# Patient Record
Sex: Female | Born: 1960 | Hispanic: No | Marital: Married | Smoking: Never smoker
Health system: Southern US, Community
[De-identification: ages and names within clinical notes are randomized; demographics above are authoritative.]

## PROBLEM LIST (undated history)

## (undated) DIAGNOSIS — I10 Essential (primary) hypertension: Secondary | ICD-10-CM

## (undated) DIAGNOSIS — E785 Hyperlipidemia, unspecified: Secondary | ICD-10-CM

## (undated) DIAGNOSIS — I2119 ST elevation (STEMI) myocardial infarction involving other coronary artery of inferior wall: Secondary | ICD-10-CM

## (undated) DIAGNOSIS — E119 Type 2 diabetes mellitus without complications: Secondary | ICD-10-CM

## (undated) HISTORY — DX: Type 2 diabetes mellitus without complications: E11.9

---

## 2017-07-20 ENCOUNTER — Ambulatory Visit: Payer: Self-pay | Admitting: Emergency Medicine

## 2018-01-04 ENCOUNTER — Inpatient Hospital Stay (HOSPITAL_COMMUNITY)
Admission: EM | Admit: 2018-01-04 | Discharge: 2018-01-07 | DRG: 854 | Disposition: A | Discharge status: Home / Self Care | Payer: Self-pay | Attending: Internal Medicine | Admitting: Internal Medicine

## 2018-01-04 ENCOUNTER — Encounter (HOSPITAL_COMMUNITY): Payer: Self-pay

## 2018-01-04 DIAGNOSIS — K8000 Calculus of gallbladder with acute cholecystitis without obstruction: Secondary | ICD-10-CM | POA: Diagnosis present

## 2018-01-04 DIAGNOSIS — R1013 Epigastric pain: Secondary | ICD-10-CM

## 2018-01-04 DIAGNOSIS — R739 Hyperglycemia, unspecified: Secondary | ICD-10-CM

## 2018-01-04 DIAGNOSIS — E119 Type 2 diabetes mellitus without complications: Secondary | ICD-10-CM

## 2018-01-04 DIAGNOSIS — E1165 Type 2 diabetes mellitus with hyperglycemia: Secondary | ICD-10-CM | POA: Diagnosis present

## 2018-01-04 DIAGNOSIS — K810 Acute cholecystitis: Secondary | ICD-10-CM

## 2018-01-04 DIAGNOSIS — E876 Hypokalemia: Secondary | ICD-10-CM | POA: Diagnosis present

## 2018-01-04 DIAGNOSIS — A419 Sepsis, unspecified organism: Principal | ICD-10-CM | POA: Diagnosis present

## 2018-01-04 HISTORY — DX: Type 2 diabetes mellitus without complications: E11.9

## 2018-01-04 LAB — COMPREHENSIVE METABOLIC PANEL
ALBUMIN: 4.4 g/dL (ref 3.5–5.0)
ALT: 24 U/L (ref 14–54)
AST: 37 U/L (ref 15–41)
Alkaline Phosphatase: 136 U/L — ABNORMAL HIGH (ref 38–126)
Anion gap: 14 (ref 5–15)
BILIRUBIN TOTAL: 0.8 mg/dL (ref 0.3–1.2)
BUN: 15 mg/dL (ref 6–20)
CHLORIDE: 98 mmol/L — AB (ref 101–111)
CO2: 22 mmol/L (ref 22–32)
Calcium: 10 mg/dL (ref 8.9–10.3)
Creatinine, Ser: 0.83 mg/dL (ref 0.44–1.00)
GFR calc Af Amer: >60 mL/min (ref >60)
GFR calc non Af Amer: >60 mL/min (ref >60)
GLUCOSE: 422 mg/dL — AB (ref 65–99)
Potassium: 3.2 mmol/L — ABNORMAL LOW (ref 3.5–5.1)
SODIUM: 134 mmol/L — AB (ref 135–145)
Total Protein: 8.2 g/dL — ABNORMAL HIGH (ref 6.5–8.1)

## 2018-01-04 LAB — I-STAT BETA HCG BLOOD, ED (MC, WL, AP ONLY): I-stat hCG, quantitative: 15.3 m[IU]/mL — ABNORMAL HIGH (ref <5)

## 2018-01-04 LAB — CBC
HEMATOCRIT: 47.4 % — AB (ref 36.0–46.0)
Hemoglobin: 16 g/dL — ABNORMAL HIGH (ref 12.0–15.0)
MCH: 28.4 pg (ref 26.0–34.0)
MCHC: 33.8 g/dL (ref 30.0–36.0)
MCV: 84 fL (ref 78.0–100.0)
Platelets: 304 10*3/uL (ref 150–400)
RBC: 5.64 MIL/uL — ABNORMAL HIGH (ref 3.87–5.11)
RDW: 12.8 % (ref 11.5–15.5)
WBC: 20.8 10*3/uL — ABNORMAL HIGH (ref 4.0–10.5)

## 2018-01-04 LAB — LIPASE, BLOOD: LIPASE: 30 U/L (ref 11–51)

## 2018-01-04 NOTE — ED Triage Notes (Signed)
Pt reports upper abd pain with n/v/d since 3pm

## 2018-01-05 ENCOUNTER — Inpatient Hospital Stay (HOSPITAL_COMMUNITY): Payer: Self-pay | Admitting: Anesthesiology

## 2018-01-05 ENCOUNTER — Encounter (HOSPITAL_COMMUNITY)
Admission: EM | Disposition: A | Discharge status: Home / Self Care | Payer: Self-pay | Source: Home / Self Care | Attending: Internal Medicine

## 2018-01-05 ENCOUNTER — Encounter (HOSPITAL_COMMUNITY): Payer: Self-pay | Admitting: Internal Medicine

## 2018-01-05 ENCOUNTER — Emergency Department (HOSPITAL_COMMUNITY): Payer: Self-pay

## 2018-01-05 DIAGNOSIS — E876 Hypokalemia: Secondary | ICD-10-CM | POA: Diagnosis present

## 2018-01-05 DIAGNOSIS — A419 Sepsis, unspecified organism: Principal | ICD-10-CM | POA: Diagnosis present

## 2018-01-05 DIAGNOSIS — E119 Type 2 diabetes mellitus without complications: Secondary | ICD-10-CM

## 2018-01-05 DIAGNOSIS — K810 Acute cholecystitis: Secondary | ICD-10-CM

## 2018-01-05 HISTORY — PX: CHOLECYSTECTOMY: SHX55

## 2018-01-05 LAB — CBG MONITORING, ED
GLUCOSE-CAPILLARY: 252 mg/dL — AB (ref 65–99)
Glucose-Capillary: 354 mg/dL — ABNORMAL HIGH (ref 65–99)

## 2018-01-05 LAB — URINALYSIS, ROUTINE W REFLEX MICROSCOPIC
BILIRUBIN URINE: NEGATIVE
KETONES UR: 20 mg/dL — AB
LEUKOCYTES UA: NEGATIVE
NITRITE: NEGATIVE
PH: 5 (ref 5.0–8.0)
Protein, ur: 30 mg/dL — AB
SPECIFIC GRAVITY, URINE: 1.035 — AB (ref 1.005–1.030)

## 2018-01-05 LAB — LACTIC ACID, PLASMA
Lactic Acid, Venous: 2.3 mmol/L (ref 0.5–1.9)
Lactic Acid, Venous: 3.4 mmol/L (ref 0.5–1.9)

## 2018-01-05 LAB — GLUCOSE, CAPILLARY
GLUCOSE-CAPILLARY: 230 mg/dL — AB (ref 65–99)
Glucose-Capillary: 148 mg/dL — ABNORMAL HIGH (ref 65–99)
Glucose-Capillary: 131 mg/dL — ABNORMAL HIGH (ref 65–99)
Glucose-Capillary: 315 mg/dL — ABNORMAL HIGH (ref 65–99)

## 2018-01-05 LAB — PROTIME-INR
INR: 1.09
PROTHROMBIN TIME: 14.1 s (ref 11.4–15.2)

## 2018-01-05 LAB — PROCALCITONIN: Procalcitonin: 1.48 ng/mL

## 2018-01-05 LAB — TYPE AND SCREEN
ABO/RH(D): O POS
ANTIBODY SCREEN: NEGATIVE

## 2018-01-05 LAB — HIV ANTIBODY (ROUTINE TESTING W REFLEX): HIV Screen 4th Generation wRfx: NONREACTIVE

## 2018-01-05 LAB — APTT: aPTT: 26 seconds (ref 24–36)

## 2018-01-05 LAB — HCG, QUANTITATIVE, PREGNANCY: hCG, Beta Chain, Quant, S: 14 m[IU]/mL — ABNORMAL HIGH (ref <5)

## 2018-01-05 LAB — ABO/RH: ABO/RH(D): O POS

## 2018-01-05 SURGERY — LAPAROSCOPIC CHOLECYSTECTOMY WITH INTRAOPERATIVE CHOLANGIOGRAM
Anesthesia: General | Site: Abdomen

## 2018-01-05 MED ORDER — PIPERACILLIN-TAZOBACTAM 3.375 G IVPB
3.3750 g | Freq: Three times a day (TID) | INTRAVENOUS | Status: DC
  Administered 2018-01-05 – 2018-01-06 (×4): 3.375 g via INTRAVENOUS
  Filled 2018-01-05 (×5): qty 50

## 2018-01-05 MED ORDER — METOCLOPRAMIDE HCL 5 MG/ML IJ SOLN
10.0000 mg | Freq: Once | INTRAMUSCULAR | Status: AC
  Administered 2018-01-05: 10 mg via INTRAVENOUS
  Filled 2018-01-05: qty 2

## 2018-01-05 MED ORDER — SODIUM CHLORIDE 0.9 % IV SOLN
INTRAVENOUS | Status: DC
  Administered 2018-01-05: 08:00:00 via INTRAVENOUS

## 2018-01-05 MED ORDER — DEXAMETHASONE SODIUM PHOSPHATE 10 MG/ML IJ SOLN
INTRAMUSCULAR | Status: AC
  Filled 2018-01-05: qty 3

## 2018-01-05 MED ORDER — KETOROLAC TROMETHAMINE 30 MG/ML IJ SOLN
INTRAMUSCULAR | Status: AC
  Filled 2018-01-05: qty 1

## 2018-01-05 MED ORDER — ETOMIDATE 2 MG/ML IV SOLN
INTRAVENOUS | Status: AC
  Filled 2018-01-05: qty 10

## 2018-01-05 MED ORDER — OXYCODONE-ACETAMINOPHEN 5-325 MG PO TABS
1.0000 | ORAL_TABLET | ORAL | Status: DC | PRN
  Administered 2018-01-05 – 2018-01-06 (×2): 1 via ORAL
  Filled 2018-01-05 (×2): qty 1

## 2018-01-05 MED ORDER — FENTANYL CITRATE (PF) 100 MCG/2ML IJ SOLN
INTRAMUSCULAR | Status: DC | PRN
  Administered 2018-01-05 (×3): 50 ug via INTRAVENOUS
  Administered 2018-01-05: 100 ug via INTRAVENOUS

## 2018-01-05 MED ORDER — ONDANSETRON HCL 4 MG/2ML IJ SOLN
INTRAMUSCULAR | Status: DC | PRN
  Administered 2018-01-05: 4 mg via INTRAVENOUS

## 2018-01-05 MED ORDER — METOPROLOL TARTRATE 5 MG/5ML IV SOLN
INTRAVENOUS | Status: AC
  Filled 2018-01-05: qty 5

## 2018-01-05 MED ORDER — INSULIN STARTER KIT- SYRINGES (ENGLISH)
1.0000 | Freq: Once | Status: AC
  Administered 2018-01-05: 1
  Filled 2018-01-05: qty 1

## 2018-01-05 MED ORDER — MORPHINE SULFATE (PF) 4 MG/ML IV SOLN: 2.0000 mg | INTRAVENOUS | Status: DC | PRN

## 2018-01-05 MED ORDER — LIVING WELL WITH DIABETES BOOK
Freq: Once | Status: AC
  Administered 2018-01-05: 18:00:00
  Filled 2018-01-05: qty 1

## 2018-01-05 MED ORDER — LACTATED RINGERS IV SOLN
INTRAVENOUS | Status: DC
  Administered 2018-01-05: 15:00:00 via INTRAVENOUS

## 2018-01-05 MED ORDER — ONDANSETRON HCL 4 MG/2ML IJ SOLN
4.0000 mg | Freq: Three times a day (TID) | INTRAMUSCULAR | Status: DC | PRN
  Administered 2018-01-05 – 2018-01-07 (×3): 4 mg via INTRAVENOUS
  Filled 2018-01-05 (×3): qty 2

## 2018-01-05 MED ORDER — BUPIVACAINE-EPINEPHRINE (PF) 0.25% -1:200000 IJ SOLN
INTRAMUSCULAR | Status: AC
  Filled 2018-01-05: qty 30

## 2018-01-05 MED ORDER — SUGAMMADEX SODIUM 200 MG/2ML IV SOLN
INTRAVENOUS | Status: DC | PRN
  Administered 2018-01-05: 200 mg via INTRAVENOUS

## 2018-01-05 MED ORDER — PHENYLEPHRINE 40 MCG/ML (10ML) SYRINGE FOR IV PUSH (FOR BLOOD PRESSURE SUPPORT)
PREFILLED_SYRINGE | INTRAVENOUS | Status: DC | PRN
  Administered 2018-01-05 (×2): 80 ug via INTRAVENOUS

## 2018-01-05 MED ORDER — INSULIN GLARGINE 100 UNIT/ML ~~LOC~~ SOLN
10.0000 [IU] | Freq: Every day | SUBCUTANEOUS | Status: DC
  Administered 2018-01-06: 10 [IU] via SUBCUTANEOUS
  Filled 2018-01-05: qty 0.1

## 2018-01-05 MED ORDER — ACETAMINOPHEN 325 MG PO TABS
650.0000 mg | ORAL_TABLET | Freq: Four times a day (QID) | ORAL | Status: DC | PRN
  Administered 2018-01-07: 650 mg via ORAL
  Filled 2018-01-05: qty 2

## 2018-01-05 MED ORDER — ZOLPIDEM TARTRATE 5 MG PO TABS: 5.0000 mg | ORAL_TABLET | Freq: Every evening | ORAL | Status: DC | PRN

## 2018-01-05 MED ORDER — SODIUM CHLORIDE 0.9 % IR SOLN
Status: DC | PRN
  Administered 2018-01-05: 1000 mL

## 2018-01-05 MED ORDER — ROCURONIUM BROMIDE 100 MG/10ML IV SOLN
INTRAVENOUS | Status: DC | PRN
  Administered 2018-01-05: 50 mg via INTRAVENOUS

## 2018-01-05 MED ORDER — INSULIN GLARGINE 100 UNIT/ML ~~LOC~~ SOLN
5.0000 [IU] | Freq: Every day | SUBCUTANEOUS | Status: DC
  Administered 2018-01-05: 5 [IU] via SUBCUTANEOUS
  Filled 2018-01-05: qty 0.05

## 2018-01-05 MED ORDER — MIDAZOLAM HCL 2 MG/2ML IJ SOLN
INTRAMUSCULAR | Status: AC
  Filled 2018-01-05: qty 2

## 2018-01-05 MED ORDER — HYDRALAZINE HCL 20 MG/ML IJ SOLN: 5.0000 mg | INTRAMUSCULAR | Status: DC | PRN

## 2018-01-05 MED ORDER — PROPOFOL 10 MG/ML IV BOLUS
INTRAVENOUS | Status: AC
  Filled 2018-01-05: qty 20

## 2018-01-05 MED ORDER — BUPIVACAINE-EPINEPHRINE 0.25% -1:200000 IJ SOLN
INTRAMUSCULAR | Status: DC | PRN
  Administered 2018-01-05: 14 mL

## 2018-01-05 MED ORDER — FENTANYL CITRATE (PF) 250 MCG/5ML IJ SOLN
INTRAMUSCULAR | Status: AC
  Filled 2018-01-05: qty 5

## 2018-01-05 MED ORDER — PROPOFOL 10 MG/ML IV BOLUS
INTRAVENOUS | Status: DC | PRN
  Administered 2018-01-05: 140 mg via INTRAVENOUS

## 2018-01-05 MED ORDER — POTASSIUM CHLORIDE 20 MEQ/15ML (10%) PO SOLN
40.0000 meq | Freq: Once | ORAL | Status: AC
  Administered 2018-01-05: 40 meq via ORAL
  Filled 2018-01-05: qty 30

## 2018-01-05 MED ORDER — LIDOCAINE 2% (20 MG/ML) 5 ML SYRINGE
INTRAMUSCULAR | Status: DC | PRN
  Administered 2018-01-05: 60 mg via INTRAVENOUS

## 2018-01-05 MED ORDER — 0.9 % SODIUM CHLORIDE (POUR BTL) OPTIME
TOPICAL | Status: DC | PRN
  Administered 2018-01-05: 1000 mL

## 2018-01-05 MED ORDER — MIDAZOLAM HCL 5 MG/5ML IJ SOLN
INTRAMUSCULAR | Status: DC | PRN
  Administered 2018-01-05: 2 mg via INTRAVENOUS

## 2018-01-05 MED ORDER — ONDANSETRON HCL 4 MG/2ML IJ SOLN
INTRAMUSCULAR | Status: AC
  Filled 2018-01-05: qty 4

## 2018-01-05 MED ORDER — METOPROLOL TARTRATE 5 MG/5ML IV SOLN
INTRAVENOUS | Status: DC | PRN
  Administered 2018-01-05: 1 mg via INTRAVENOUS

## 2018-01-05 MED ORDER — SODIUM CHLORIDE 0.9 % IV SOLN
INTRAVENOUS | Status: DC
  Administered 2018-01-05 – 2018-01-07 (×3): via INTRAVENOUS

## 2018-01-05 MED ORDER — DEXAMETHASONE SODIUM PHOSPHATE 10 MG/ML IJ SOLN
INTRAMUSCULAR | Status: DC | PRN
  Administered 2018-01-05: 10 mg via INTRAVENOUS

## 2018-01-05 MED ORDER — KETOROLAC TROMETHAMINE 30 MG/ML IJ SOLN
INTRAMUSCULAR | Status: DC | PRN
  Administered 2018-01-05: 30 mg via INTRAVENOUS

## 2018-01-05 MED ORDER — SODIUM CHLORIDE 0.9 % IV BOLUS
1000.0000 mL | Freq: Once | INTRAVENOUS | Status: AC
  Administered 2018-01-05: 1000 mL via INTRAVENOUS

## 2018-01-05 MED ORDER — INSULIN ASPART 100 UNIT/ML ~~LOC~~ SOLN
5.0000 [IU] | Freq: Once | SUBCUTANEOUS | Status: AC
  Administered 2018-01-05: 5 [IU] via SUBCUTANEOUS
  Filled 2018-01-05: qty 1

## 2018-01-05 MED ORDER — INSULIN ASPART 100 UNIT/ML ~~LOC~~ SOLN
0.0000 [IU] | Freq: Three times a day (TID) | SUBCUTANEOUS | Status: DC
  Administered 2018-01-05: 1 [IU] via SUBCUTANEOUS
  Administered 2018-01-05 – 2018-01-06 (×3): 5 [IU] via SUBCUTANEOUS
  Administered 2018-01-06: 2 [IU] via SUBCUTANEOUS
  Filled 2018-01-05: qty 1

## 2018-01-05 MED ORDER — MORPHINE SULFATE (PF) 4 MG/ML IV SOLN: 1.0000 mg | INTRAVENOUS | Status: DC | PRN

## 2018-01-05 MED ORDER — STERILE WATER FOR IRRIGATION IR SOLN
Status: DC | PRN
  Administered 2018-01-05: 1000 mL

## 2018-01-05 SURGICAL SUPPLY — 40 items
APPLIER CLIP 5 13 M/L LIGAMAX5 (MISCELLANEOUS) ×3
BLADE CLIPPER SURG (BLADE) IMPLANT
CANISTER SUCT 3000ML PPV (MISCELLANEOUS) ×3 IMPLANT
CHLORAPREP W/TINT 26ML (MISCELLANEOUS) ×3 IMPLANT
CLIP APPLIE 5 13 M/L LIGAMAX5 (MISCELLANEOUS) ×1 IMPLANT
CLOSURE WOUND 1/2 X4 (GAUZE/BANDAGES/DRESSINGS) ×1
COVER MAYO STAND STRL (DRAPES) ×3 IMPLANT
COVER SURGICAL LIGHT HANDLE (MISCELLANEOUS) ×3 IMPLANT
DERMABOND ADVANCED (GAUZE/BANDAGES/DRESSINGS) ×2
DERMABOND ADVANCED .7 DNX12 (GAUZE/BANDAGES/DRESSINGS) ×1 IMPLANT
DEVICE TROCAR PUNCTURE CLOSURE (ENDOMECHANICALS) ×3 IMPLANT
DRAPE C-ARM 42X72 X-RAY (DRAPES) ×3 IMPLANT
ELECT REM PT RETURN 9FT ADLT (ELECTROSURGICAL) ×3
ELECTRODE REM PT RTRN 9FT ADLT (ELECTROSURGICAL) ×1 IMPLANT
GLOVE BIO SURGEON STRL SZ7 (GLOVE) ×3 IMPLANT
GLOVE BIOGEL PI IND STRL 7.5 (GLOVE) ×1 IMPLANT
GLOVE BIOGEL PI INDICATOR 7.5 (GLOVE) ×2
GOWN STRL REUS W/ TWL LRG LVL3 (GOWN DISPOSABLE) ×3 IMPLANT
GOWN STRL REUS W/TWL LRG LVL3 (GOWN DISPOSABLE) ×6
KIT BASIN OR (CUSTOM PROCEDURE TRAY) ×3 IMPLANT
KIT TURNOVER KIT B (KITS) ×3 IMPLANT
NS IRRIG 1000ML POUR BTL (IV SOLUTION) ×3 IMPLANT
PAD ARMBOARD 7.5X6 YLW CONV (MISCELLANEOUS) ×3 IMPLANT
POUCH RETRIEVAL ECOSAC 10 (ENDOMECHANICALS) ×1 IMPLANT
POUCH RETRIEVAL ECOSAC 10MM (ENDOMECHANICALS) ×2
SCISSORS LAP 5X35 DISP (ENDOMECHANICALS) ×3 IMPLANT
SET CHOLANGIOGRAPH 5 50 .035 (SET/KITS/TRAYS/PACK) ×3 IMPLANT
SET IRRIG TUBING LAPAROSCOPIC (IRRIGATION / IRRIGATOR) ×3 IMPLANT
SLEEVE ENDOPATH XCEL 5M (ENDOMECHANICALS) ×6 IMPLANT
SPECIMEN JAR SMALL (MISCELLANEOUS) ×3 IMPLANT
STRIP CLOSURE SKIN 1/2X4 (GAUZE/BANDAGES/DRESSINGS) ×2 IMPLANT
SUT MNCRL AB 4-0 PS2 18 (SUTURE) ×3 IMPLANT
SUT VICRYL 0 UR6 27IN ABS (SUTURE) ×6 IMPLANT
TOWEL OR 17X24 6PK STRL BLUE (TOWEL DISPOSABLE) ×3 IMPLANT
TOWEL OR 17X26 10 PK STRL BLUE (TOWEL DISPOSABLE) ×3 IMPLANT
TRAY LAPAROSCOPIC MC (CUSTOM PROCEDURE TRAY) ×3 IMPLANT
TROCAR XCEL BLUNT TIP 100MML (ENDOMECHANICALS) ×3 IMPLANT
TROCAR XCEL NON-BLD 5MMX100MML (ENDOMECHANICALS) ×3 IMPLANT
TUBING INSUFFLATION (TUBING) ×3 IMPLANT
WATER STERILE IRR 1000ML POUR (IV SOLUTION) ×3 IMPLANT

## 2018-01-05 NOTE — Progress Notes (Addendum)
Patient arrived to 6n22 A&Ox4, VSS, IV intact.  Noted to have 4 port sites present on abdomen with steri strip closure.  Denies pain at this time.  Son at bedside as Nurse, learning disabilitytranslator.  Will continue to monitor.

## 2018-01-05 NOTE — Progress Notes (Signed)
Patient arrived from ED to 6n22 A&Ox4, VSS, IV intact and infusing.  Son present at bedside.  Skin intact with no issues.  Patient and family oriented to room and equipment.  Will continue to monitor.

## 2018-01-05 NOTE — H&P (Signed)
History and Physical    Renn Stille ZOX:[id number] DOB: 21-Oct-1960 DOA: 01/04/2018  Referring MD/NP/PA:   PCP: Patient, No Pcp Per   Patient coming from:  The patient is coming from home.  At baseline, pt is independent for most of ADL.  Chief Complaint: Nausea, vomiting, diarrhea, abdominal pain  HPI: Stephanie Cooke is a 57 y.o. female without significant medical history, who presents with nausea, vomiting, diarrhea and abdominal pain.  Pt speaks arabic language, does not understand [place]. History is obtained through translator and partially with help of her son. Patient states that her symptoms started yesterday. She has nausea, vomiting, diarrhea, abdominal pain. She vomited 4 times and had for loose stool bowel movement today. Her abdominal pain is located in the right upper quadrant, intermittent, sharp, nonradiating. Patient does not have fever or chills. She denies chest pain, shortness of breath, cough symptoms of UTI. No unilateral weakness. She states that she occasionally takes ibuprofen or Tylenol for mild headache. No history of DM.   ED Course: pt was found to have WBC 20.8, POC bHCG 15.3, potassium 3.2, normal LFT, lipase normal, creatinine normal, tachycardia, tachypnea, oxygen 97% on room air. Abdominal ultrasound showed cholecystitis and common bile duct dilation 8.2 mm. Pt is admitted to MedSurg bed as inpatient. Gen. Surgeon, Dr. Cliffton Asters was consulted.  Review of Systems:   General: no fevers, chills, no body weight gain, has poor appetite, has fatigue HEENT: no blurry vision, hearing changes or sore throat Respiratory: no dyspnea, coughing, wheezing CV: no chest pain, no palpitations GI: has nausea, vomiting, abdominal pain, diarrhea, no constipation GU: no dysuria, burning on urination, increased urinary frequency, hematuria  Ext: no leg edema Neuro: no unilateral weakness, numbness, or tingling, no vision change or hearing loss Skin: no rash, no skin tear. MSK: No  muscle spasm, no deformity, no limitation of range of movement in spin Heme: No easy bruising.  Travel history: No recent long distant travel.  Allergy: No Known Allergies  History reviewed. No pertinent past medical history.  History reviewed. No pertinent surgical history.  Social History:  reports that she has never smoked. She has never used smokeless tobacco. She reports that she drank alcohol. She reports that she has current or past drug history.  Family History:  Family History  Problem Relation Age of Onset  . Hypertension Maternal Aunt      Prior to Admission medications   Not on File    Physical Exam: Vitals:   01/05/18 0415 01/05/18 0430 01/05/18 0445 01/05/18 0500  BP:  127/77 137/86 139/84  Pulse: 94 (!) 101 (!) 107 (!) 104  Resp: 20 20 15 16   Temp:      TempSrc:      SpO2: 99% 98% 99% 100%   General: Not in acute distress. dry mucus and membrane HEENT:       Eyes: PERRL, EOMI, no scleral icterus.       ENT: No discharge from the ears and nose, no pharynx injection, no tonsillar enlargement.        Neck: No JVD, no bruit, no mass felt. Heme: No neck lymph node enlargement. Cardiac: S1/S2, RRR, No murmurs, No gallops or rubs. Respiratory: No rales, wheezing, rhonchi or rubs. GI: Soft, nondistended, has tenderness in RUQ, no rebound pain, no organomegaly, BS present. GU: No hematuria Ext: No pitting leg edema bilaterally. 2+DP/PT pulse bilaterally. Musculoskeletal: No joint deformities, No joint redness or warmth, no limitation of ROM in spin. Skin: No rashes.  Neuro:  Alert, oriented X3, cranial nerves II-XII grossly intact, moves all extremities normally.  Psych: Patient is not psychotic, no suicidal or hemocidal ideation.  Labs on Admission: I have personally reviewed following labs and imaging studies  CBC: Recent Labs  Lab 01/04/18 2221  WBC 20.8*  HGB 16.0*  HCT 47.4*  MCV 84.0  PLT 304   Basic Metabolic Panel: Recent Labs  Lab  01/04/18 2221  NA 134*  K 3.2*  CL 98*  CO2 22  GLUCOSE 422*  BUN 15  CREATININE 0.83  CALCIUM 10.0   GFR: CrCl cannot be calculated (Unknown ideal weight.). Liver Function Tests: Recent Labs  Lab 01/04/18 2221  AST 37  ALT 24  ALKPHOS 136*  BILITOT 0.8  PROT 8.2*  ALBUMIN 4.4   Recent Labs  Lab 01/04/18 2221  LIPASE 30   No results for input(s): AMMONIA in the last 168 hours. Coagulation Profile: No results for input(s): INR, PROTIME in the last 168 hours. Cardiac Enzymes: No results for input(s): CKTOTAL, CKMB, CKMBINDEX, TROPONINI in the last 168 hours. BNP (last 3 results) No results for input(s): PROBNP in the last 8760 hours. HbA1C: No results for input(s): HGBA1C in the last 72 hours. CBG: No results for input(s): GLUCAP in the last 168 hours. Lipid Profile: No results for input(s): CHOL, HDL, LDLCALC, TRIG, CHOLHDL, LDLDIRECT in the last 72 hours. Thyroid Function Tests: No results for input(s): TSH, T4TOTAL, FREET4, T3FREE, THYROIDAB in the last 72 hours. Anemia Panel: No results for input(s): VITAMINB12, FOLATE, FERRITIN, TIBC, IRON, RETICCTPCT in the last 72 hours. Urine analysis: No results found for: COLORURINE, APPEARANCEUR, LABSPEC, PHURINE, GLUCOSEU, HGBUR, BILIRUBINUR, KETONESUR, PROTEINUR, UROBILINOGEN, NITRITE, LEUKOCYTESUR Sepsis Labs: @LABRCNTIP (procalcitonin:4,lacticidven:4) )No results found for this or any previous visit (from the past 240 hour(s)).   Radiological Exams on Admission: [place] Abdomen Limited Ruq  Result Date: 01/05/2018 CLINICAL DATA:  Epigastric pain. EXAM: ULTRASOUND ABDOMEN LIMITED RIGHT UPPER QUADRANT COMPARISON:  None. FINDINGS: Gallbladder: There is a large mobile stone in the gallbladder measuring about 2.8 cm diameter. Mild pericholecystic edema with mild wall thickening at 4.2 mm. Murphy's sign is negative. Common bile duct: Diameter: 8.2 mm, dilated. No intraluminal stones are demonstrated but the distal duct is  obscured by bowel gas. Liver: Diffusely increased hepatic parenchymal echotexture likely indicating fatty infiltration. No focal lesions identified. Portal vein is patent on color Doppler imaging with normal direction of blood flow towards the liver. IMPRESSION: Cholelithiasis with gallbladder wall thickening and edema suggestive of cholecystitis. Murphy's sign is negative. Mild bile duct dilatation. Fatty infiltration of the liver. Electronically Signed   By: Burman NievesWilliam  Stevens M.D.   On: 01/05/2018 04:18     EKG:  Not done in ED, will get one.   Assessment/Plan Principal Problem:   Acute cholecystitis Active Problems:   Sepsis (HCC)   New onset type 2 diabetes mellitus (HCC)   Hypokalemia   Sepsis due to acute cholecystitis: Patient's nausea, vomiting, abdominal pain most likely due to cholecystitis as evidenced by abdominal ultrasound. Patient also has diarrhea, which may be due to cholecystitis. Will observe closely, if getting worse, may need to check C. diff PCR. Patient meets criteria for sepsis with leukocytosis, tachycardia and tachypnea. Currently hemodynamically stable.Gen. Surgeon, Dr. Cliffton AstersWhite was consulted-->likely cholecystectomy when blood sugar is controled.  -will admit to med-surg bed as inpt -start zosyn IV -prn Zofran for nausea, morphine for pain -will get Procalcitonin and trend lactic acid levels per sepsis protocol. -IVF: 2L of NS bolus in ED,  followed by 125 cc/h  -f/u Bx -F/u quantitative beta-hCG  Hypokalemia: K=3.2 on admission. - Repleted - Check Mg level  New onset type 2 diabetes mellitus (HCC):  Pt's blood sugar is 482, likely has new onset DM. Renal function normal. -will start lantus 5 U daily -SSI -check A1c and FLP -consult to diabetic educator.   DVT ppx: SCD Code Status: Full code Family Communication:   Yes, patient's son   at bed side Disposition Plan:  Anticipate discharge back to previous home environment Consults called:  Gen. Surgeon,  Dr. Cliffton Asters Admission status: medical floor/inpt   Date of Service 01/05/2018    Lorretta Harp [facility] Pager [phone]  If 7PM-7AM, please contact night-coverage [email or web address] Password [facility] 01/05/2018, 5:48 AM

## 2018-01-05 NOTE — Interval H&P Note (Signed)
History and Physical Interval Note:  01/05/2018 3:19 PM I have seen patient.  Discussed lap chole with her via the son.  Translator service does not work due to her dialect.   Stephanie Cooke  has presented today for surgery, with the diagnosis of cholecysitis  The various methods of treatment have been discussed with the patient and family. After consideration of risks, benefits and other options for treatment, the patient has consented to  Procedure(s): LAPAROSCOPIC CHOLECYSTECTOMY WITH INTRAOPERATIVE CHOLANGIOGRAM (N/A) as a surgical intervention .  The patient's history has been reviewed, patient examined, no change in status, stable for surgery.  I have reviewed the patient's chart and labs.  Questions were answered to the patient's satisfaction.     Stephanie Cooke

## 2018-01-05 NOTE — ED Notes (Signed)
Pt CBG was 252, notified Holley(RN)

## 2018-01-05 NOTE — Progress Notes (Signed)
CRITICAL VALUE ALERT  Critical Value: Lactic Acid 3.4  Date & Time Notied:  11:17am 01/05/2018  Provider Notified: Mikeal HawthorneGarba  Orders Received/Actions taken: MD notified

## 2018-01-05 NOTE — Care Management Note (Addendum)
Case Management Note  Patient Details  Name: Stephanie Cooke MRN: [id number] Date of Birth: 03/26/1961  Subjective/Objective:                    Action/Plan: Follow up hospital appointment at [facility] January 31, 2018 at 3 pm  [facility] , medication assistance once discharge medications determined . Will continue to follow .  Expected Discharge Date:                  Expected Discharge Plan:  Home/Self Care  In-House Referral:  Financial Counselor  Discharge planning Services  CM Consult, [facility], [facility], Medication Assistance  Post Acute Care Choice:  NA Choice offered to:     DME Arranged:  N/A DME Agency:     HH Arranged:  NA HH Agency:  NA  Status of Service:  In process, will continue to follow  If discussed at Long Length of Stay Meetings, dates discussed:    Additional Comments:  Stephanie Cooke, Stephanie Primm Marie, RN 01/05/2018, 11:31 AM

## 2018-01-05 NOTE — H&P (Signed)
CC: RUQ pain, consult by Amie Portland PA-C. Interpreter service used. Family had left the room.  HPI: Stephanie Cooke is an 57 y.o. female who is here for 1d hx of RUQ pain, sharp, intermittent associated with n/v/d. 4 episodes of emesis. 4 episodes of loose stool. Pain does not radiate. Nothing makes pain better or worse.  Denies fever/chills. Over last day, pain comes and goes but does not seem to be related to anything. Nothing to eat/drink since 5pm 4/3.  PSH: Denies prior surgeries  PMH: Denies known health history including being told she did not have DM 19yrago.  History reviewed. No pertinent past medical history.  History reviewed. No pertinent surgical history.  No family history on file.  Social: Denies tobacco/EtOH/drugs  Allergies: No Known Allergies  Medications: I have reviewed the patient's current medications.  Results for orders placed or performed during the hospital encounter of 01/04/18 (from the past 48 hour(s))  Lipase, blood     Status: None   Collection Time: 01/04/18 10:21 PM  Result Value Ref Range   Lipase 30 11 - 51 U/L    Comment: Performed at [facility] [id number] [place] [address] Comprehensive metabolic panel     Status: Abnormal   Collection Time: 01/04/18 10:21 PM  Result Value Ref Range   Sodium 134 (L) 135 - 145 mmol/L   Potassium 3.2 (L) 3.5 - 5.1 mmol/L   Chloride 98 (L) 101 - 111 mmol/L   CO2 22 22 - 32 mmol/L   Glucose, Bld 422 (H) 65 - 99 mg/dL   BUN 15 6 - 20 mg/dL   Creatinine, Ser 0.83 0.44 - 1.00 mg/dL   Calcium 10.0 8.9 - 10.3 mg/dL   Total Protein 8.2 (H) 6.5 - 8.1 g/dL   Albumin 4.4 3.5 - 5.0 g/dL   AST 37 15 - 41 U/L   ALT 24 14 - 54 U/L   Alkaline Phosphatase 136 (H) 38 - 126 U/L   Total Bilirubin 0.8 0.3 - 1.2 mg/dL   GFR calc non Af Amer >60 >60 mL/min   GFR calc Af Amer >60 >60 mL/min    Comment: (NOTE) The eGFR has been calculated using the CKD EPI equation. This calculation has not been  validated in all clinical situations. eGFR's persistently <60 mL/min signify possible Chronic Kidney Disease.    Anion gap 14 5 - 15    Comment: Performed at [address] [id number] CBC     Status: Abnormal   Collection Time: 01/04/18 10:21 PM  Result Value Ref Range   WBC 20.8 (H) 4.0 - 10.5 K/uL   RBC 5.64 (H) 3.87 - 5.11 MIL/uL   Hemoglobin 16.0 (H) 12.0 - 15.0 g/dL   HCT 47.4 (H) 36.0 - 46.0 %   MCV 84.0 78.0 - 100.0 fL   MCH 28.4 26.0 - 34.0 pg   MCHC 33.8 30.0 - 36.0 g/dL   RDW 12.8 11.5 - 15.5 %   Platelets 304 150 - 400 K/uL    Comment: Performed at [facility], [place] [address] I-Stat beta hCG blood, ED     Status: Abnormal   Collection Time: 01/04/18 10:38 PM  Result Value Ref Range   I-stat hCG, quantitative 15.3 (H) <5 mIU/mL   Comment 3            Comment:   GEST. AGE  CONC.  (mIU/mL)   <=1 WEEK        5 - 50     2 WEEKS       50 - 500     3 WEEKS       100 - 10,000     4 WEEKS     1,000 - 30,000        FEMALE AND NON-PREGNANT FEMALE:     LESS THAN 5 mIU/mL     US Abdomen Limited Ruq  Result Date: 01/05/2018 CLINICAL DATA:  Epigastric pain. EXAM: ULTRASOUND ABDOMEN LIMITED RIGHT UPPER QUADRANT COMPARISON:  None. FINDINGS: Gallbladder: There is a large mobile stone in the gallbladder measuring about 2.8 cm diameter. Mild pericholecystic edema with mild wall thickening at 4.2 mm. Murphy's sign is negative. Common bile duct: Diameter: 8.2 mm, dilated. No intraluminal stones are demonstrated but the distal duct is obscured by bowel gas. Liver: Diffusely increased hepatic parenchymal echotexture likely indicating fatty infiltration. No focal lesions identified. Portal vein is patent on color Doppler imaging with normal direction of blood flow towards the liver. IMPRESSION: Cholelithiasis with gallbladder wall thickening and edema suggestive of cholecystitis. Murphy's sign is negative. Mild bile  duct dilatation. Fatty infiltration of the liver. Electronically Signed   By: Lucienne Capers M.D.   On: 01/05/2018 04:18    ROS - all of the below systems have been reviewed with the patient and positives are indicated with bold text General: chills, fever or night sweats Eyes: blurry vision or double vision ENT: epistaxis or sore throat Allergy/Immunology: itchy/watery eyes or nasal congestion Hematologic/Lymphatic: bleeding problems, blood clots or swollen lymph nodes Endocrine: temperature intolerance or unexpected weight changes Breast: new or changing breast lumps or nipple discharge Resp: cough, shortness of breath, or wheezing CV: chest pain or dyspnea on exertion GI: as per HPI GU: dysuria, trouble voiding, or hematuria MSK: joint pain or joint stiffness Neuro: TIA or stroke symptoms Derm: pruritus and skin lesion changes Psych: anxiety and depression  PE Blood pressure (!) 112/95, pulse 94, temperature 99.2 F (37.3 C), temperature source Oral, resp. rate 20, SpO2 99 %. Constitutional: NAD; conversant; no deformities Eyes: Moist conjunctiva; no lid lag; anicteric; PERRL Neck: Trachea midline; no thyromegaly Lungs: Normal respiratory effort; no tactile fremitus CV: RRR; no palpable thrills; no pitting edema GI: Abd soft, mildly ttp in RUQ; nondistended; no palpable hepatosplenomegaly. Negative Murphy's sign MSK: Normal gait; no clubbing/cyanosis Psychiatric: Appropriate affect; alert and oriented x3 Lymphatic: No palpable cervical or axillary lymphadenopathy  Results for orders placed or performed during the hospital encounter of 01/04/18 (from the past 48 hour(s))  Lipase, blood     Status: None   Collection Time: 01/04/18 10:21 PM  Result Value Ref Range   Lipase 30 11 - 51 U/L    Comment: Performed at [facility], [place] [address], [place], [place] [address]  Comprehensive metabolic panel     Status: Abnormal   Collection Time: 01/04/18 10:21 PM  Result  Value Ref Range   Sodium 134 (L) 135 - 145 mmol/L   Potassium 3.2 (L) 3.5 - 5.1 mmol/L   Chloride 98 (L) 101 - 111 mmol/L   CO2 22 22 - 32 mmol/L   Glucose, Bld 422 (H) 65 - 99 mg/dL   BUN 15 6 - 20 mg/dL   Creatinine, Ser 0.83 0.44 - 1.00 mg/dL   Calcium 10.0 8.9 - 10.3 mg/dL   Total Protein 8.2 (H) 6.5 - 8.1 g/dL  Albumin 4.4 3.5 - 5.0 g/dL   AST 37 15 - 41 U/L   ALT 24 14 - 54 U/L   Alkaline Phosphatase 136 (H) 38 - 126 U/L   Total Bilirubin 0.8 0.3 - 1.2 mg/dL   GFR calc non Af Amer >60 >60 mL/min   GFR calc Af Amer >60 >60 mL/min    Comment: (NOTE) The eGFR has been calculated using the CKD EPI equation. This calculation has not been validated in all clinical situations. eGFR's persistently <60 mL/min signify possible Chronic Kidney Disease.    Anion gap 14 5 - 15    Comment: Performed at [facility] [address] [place], [place] [address]  CBC     Status: Abnormal   Collection Time: 01/04/18 10:21 PM  Result Value Ref Range   WBC 20.8 (H) 4.0 - 10.5 K/uL   RBC 5.64 (H) 3.87 - 5.11 MIL/uL   Hemoglobin 16.0 (H) 12.0 - 15.0 g/dL   HCT 47.4 (H) 36.0 - 46.0 %   MCV 84.0 78.0 - 100.0 fL   MCH 28.4 26.0 - 34.0 pg   MCHC 33.8 30.0 - 36.0 g/dL   RDW 12.8 11.5 - 15.5 %   Platelets 304 150 - 400 K/uL    Comment: Performed at [facility] [address] [place], [place] [address]  I-Stat beta hCG blood, ED     Status: Abnormal   Collection Time: 01/04/18 10:38 PM  Result Value Ref Range   I-stat hCG, quantitative 15.3 (H) <5 mIU/mL   Comment 3            Comment:   GEST. AGE      CONC.  (mIU/mL)   <=1 WEEK        5 - 50     2 WEEKS       50 - 500     3 WEEKS       100 - 10,000     4 WEEKS     1,000 - 30,000        FEMALE AND NON-PREGNANT FEMALE:     LESS THAN 5 mIU/mL     US Abdomen Limited Ruq  Result Date: 01/05/2018 CLINICAL DATA:  Epigastric pain. EXAM: ULTRASOUND ABDOMEN LIMITED RIGHT UPPER QUADRANT COMPARISON:  None. FINDINGS: Gallbladder:  There is a large mobile stone in the gallbladder measuring about 2.8 cm diameter. Mild pericholecystic edema with mild wall thickening at 4.2 mm. Murphy's sign is negative. Common bile duct: Diameter: 8.2 mm, dilated. No intraluminal stones are demonstrated but the distal duct is obscured by bowel gas. Liver: Diffusely increased hepatic parenchymal echotexture likely indicating fatty infiltration. No focal lesions identified. Portal vein is patent on color Doppler imaging with normal direction of blood flow towards the liver. IMPRESSION: Cholelithiasis with gallbladder wall thickening and edema suggestive of cholecystitis. Murphy's sign is negative. Mild bile duct dilatation. Fatty infiltration of the liver. Electronically Signed   By: Lucienne Capers M.D.   On: 01/05/2018 04:18    A/P: Joline Encalada is an 57 y.o. female with likely new onset DM here with acute cholecystitis  -Medicine admission given newly diagnosed diabetes and CBG 422 here - appreciate the assistance in her care -NPO, IVF, IV Zosyn -Quant hCG -The anatomy & physiology of hepatobiliary & pancreatic function was discussed.  The pathophysiology of gallbladder dysfunction was discussed.  Natural history risks without surgery was discussed.   I feel the risks of no intervention will lead  to serious problems that outweigh the operative risks; therefore, I recommended cholecystectomy to remove the pathology.  I explained laparoscopic techniques with possible need for an open approach.  Possible cholangiogram to evaluate the bilary tract was explained as well.   -The planned procedure, material risks (including but not limited to pain, bleeding, infection, abscess, bile leak, injury to surrounding structures/bowel/vessels/nerves/common bile duct, need for additional procedures, hernia, heart attack, stroke, death.  I noted a good likelihood this will help address the problem.  Possibility that this will not correct all abdominal symptoms was  explained.  Goals of post-operative recovery were discussed as well.  She and her son's questions were answered to their satisfaction, she voiced understanding and wishes to proceed with surgery. -I discussed that Dr. Donne Hazel would be by to see her prior to surgery and would likely be the physician performing her surgery once her blood sugar is more normal  Sharon Mt. Dema Severin, M.D. [facility]

## 2018-01-05 NOTE — ED Provider Notes (Signed)
[facility] EMERGENCY DEPARTMENT Provider Note   CSN: [id number] Arrival date & time: 01/04/18  2151     History   Chief Complaint Chief Complaint  Patient presents with  . Abdominal Pain    HPI Ethleen Lormand is a 57 y.o. female.  The history is provided by the patient and medical records. Language interpreter used: Family at bedside aiding in translation.     Breland Elders is a 57 y.o. female  with no known PMH who presents to the Emergency Department complaining of acute onset of upper abdominal pain which began about 3PM this afternoon. Pain and nausea have increased throughout the night. Associated with nausea, 5-6 episodes of emesis and multiple non-bloody loose stools. Denies hx of similar. No medications taken prior to arrival. No fever, chills, chest pain or shortness of breath.    History reviewed. No pertinent past medical history.  Patient Active Problem List   Diagnosis Date Noted  . Acute cholecystitis 01/05/2018  . Diabetes mellitus without complication ([place]) 52/77/8242  . Sepsis ([place]) 01/05/2018    History reviewed. No pertinent surgical history.   OB History   None      Home Medications    Prior to Admission medications   Not on File    Family History No family history on file.  Social History Social History   Tobacco Use  . Smoking status: Not on file  Substance Use Topics  . Alcohol use: Not on file  . Drug use: Not on file     Allergies   Patient has no known allergies.   Review of Systems Review of Systems  Gastrointestinal: Positive for abdominal pain, diarrhea, nausea and vomiting.  All other systems reviewed and are negative.    Physical Exam Updated Vital Signs BP (!) 112/95   Pulse 94   Temp 99.2 F (37.3 C) (Oral)   Resp 20   SpO2 99%   Physical Exam  Constitutional: She is oriented to person, place, and time. She appears well-developed and well-nourished. No distress.  HENT:  Head:  Normocephalic and atraumatic.  Cardiovascular: Normal rate, regular rhythm and normal heart sounds.  No murmur heard. Pulmonary/Chest: Effort normal and breath sounds normal. No respiratory distress.  Abdominal: Soft. Bowel sounds are normal. She exhibits no distension.  Tenderness to palpation of epigastrium and RUQ.   Musculoskeletal: She exhibits no edema.  Neurological: She is alert and oriented to person, place, and time.  Skin: Skin is warm and dry.  Nursing note and vitals reviewed.    ED Treatments / Results  Labs (all labs ordered are listed, but only abnormal results are displayed) Labs Reviewed  COMPREHENSIVE METABOLIC PANEL - Abnormal; Notable for the following components:      Result Value   Sodium 134 (*)    Potassium 3.2 (*)    Chloride 98 (*)    Glucose, Bld 422 (*)    Total Protein 8.2 (*)    Alkaline Phosphatase 136 (*)    All other components within normal limits  CBC - Abnormal; Notable for the following components:   WBC 20.8 (*)    RBC 5.64 (*)    Hemoglobin 16.0 (*)    HCT 47.4 (*)    All other components within normal limits  I-STAT BETA HCG BLOOD, ED ([facility], [facility], [facility] ONLY) - Abnormal; Notable for the following components:   I-stat hCG, quantitative 15.3 (*)    All other components within normal limits  LIPASE, BLOOD  URINALYSIS,  ROUTINE W REFLEX MICROSCOPIC    EKG None  Radiology US Abdomen Limited Ruq  Result Date: 01/05/2018 CLINICAL DATA:  Epigastric pain. EXAM: ULTRASOUND ABDOMEN LIMITED RIGHT UPPER QUADRANT COMPARISON:  None. FINDINGS: Gallbladder: There is a large mobile stone in the gallbladder measuring about 2.8 cm diameter. Mild pericholecystic edema with mild wall thickening at 4.2 mm. Murphy's sign is negative. Common bile duct: Diameter: 8.2 mm, dilated. No intraluminal stones are demonstrated but the distal duct is obscured by bowel gas. Liver: Diffusely increased hepatic parenchymal echotexture likely indicating fatty infiltration. No  focal lesions identified. Portal vein is patent on color Doppler imaging with normal direction of blood flow towards the liver. IMPRESSION: Cholelithiasis with gallbladder wall thickening and edema suggestive of cholecystitis. Murphy's sign is negative. Mild bile duct dilatation. Fatty infiltration of the liver. Electronically Signed   By: Lucienne Capers M.D.   On: 01/05/2018 04:18    Procedures Procedures (including critical care time)  Medications Ordered in ED Medications  sodium chloride 0.9 % bolus 1,000 mL (1,000 mLs Intravenous New Bag/Given 01/05/18 0353)  metoCLOPramide (REGLAN) injection 10 mg (10 mg Intravenous Given 01/05/18 0353)     Initial Impression / Assessment and Plan / ED Course  I have reviewed the triage vital signs and the nursing notes.  Pertinent labs & imaging results that were available during my care of the patient were reviewed by me and considered in my medical decision making (see chart for details).    Alexsia Klindt is a 57 y.o. female who presents to ED for acute onset of upper abdominal pain associated with n/v/d. On exam, patient is afebrile, hemodynamically stable with tenderness to epigastrium and RUQ. Labs reviewed: leukocytosis of 20.8. Alk phos of 136. Lipase wdl. Glucose elevated at 422 (normal AG and Co2). Patient denies history of DM. Family report that her last doctor's appointment was a little over a year ago and did not mention anything about high sugar levels. Likely new diagnosis of DM. RUQ ultrasound obtained showing findings concerning for acute cholecystitis. General surgery, Dr. Dema Severin, consulted who will evaluate patient. Given likely new onset DM, requesting medical admission. Hospitalist consulted who will admit.   Patient discussed with Dr. Leonides Schanz who agrees with treatment plan.   Final Clinical Impressions(s) / ED Diagnoses   Final diagnoses:  Epigastric pain  Acute cholecystitis  Hyperglycemia    ED Discharge Orders    None         Siham Bucaro, Ozella Almond, PA-C 01/05/18 0516    Vonne Mcdanel, Delice Bison, DO 01/05/18 [id number]

## 2018-01-05 NOTE — Anesthesia Preprocedure Evaluation (Addendum)
Anesthesia Evaluation  Patient identified by MRN, date of birth, ID band Patient awake    Reviewed: Allergy & Precautions, NPO status , Patient's Chart, lab work & pertinent test results  Airway Mallampati: I  TM Distance: >3 FB Neck ROM: Full    Dental  (+) Dental Advisory Given, Poor Dentition   Pulmonary neg pulmonary ROS,    breath sounds clear to auscultation       Cardiovascular negative cardio ROS   Rhythm:Regular Rate:Normal     Neuro/Psych negative neurological ROS  negative psych ROS   GI/Hepatic negative GI ROS, Neg liver ROS,   Endo/Other  diabetes  Renal/GU negative Renal ROS  negative genitourinary   Musculoskeletal negative musculoskeletal ROS (+)   Abdominal   Peds  Hematology negative hematology ROS (+)   Anesthesia Other Findings   Reproductive/Obstetrics                            Anesthesia Physical Anesthesia Plan  ASA: II  Anesthesia Plan: General   Post-op Pain Management:    Induction: Intravenous  PONV Risk Score and Plan: 4 or greater and Ondansetron, Dexamethasone, Midazolam, Scopolamine patch - Pre-op and Treatment may vary due to age or medical condition  Airway Management Planned: Oral ETT  Additional Equipment: None  Intra-op Plan:   Post-operative Plan: Extubation in OR  Informed Consent: I have reviewed the patients History and Physical, chart, labs and discussed the procedure including the risks, benefits and alternatives for the proposed anesthesia with the patient or authorized representative who has indicated his/her understanding and acceptance.   Dental advisory given  Plan Discussed with: CRNA  Anesthesia Plan Comments: (Son at bedside and translating. )        Anesthesia Quick Evaluation

## 2018-01-05 NOTE — Progress Notes (Signed)
Spoke with Dr. Jean RosenthalJackson in regards to elevated HCG. She will make Dr. Dwain SarnaWakefield aware

## 2018-01-05 NOTE — ED Notes (Signed)
Pt does not speak english, only arabic

## 2018-01-05 NOTE — Op Note (Signed)
Preoperative diagnosis:acute cholecystitis Postoperative diagnosis:same as above Procedure: Laparoscopic cholecystectomy Surgeon: Dr. Harden MoMatt Granite Godman Anesthesia: Gen. Specimens: gb to pathology Estimated blood loss:minimal Complications: None Drains: none Sponge count was correct at completion Disposition to recovery stable  Indications: This is a57 yof who has gallstones and ultrasoundand appears to have acute cholecystitis. We discussed going to the or for lap chole.  Procedure: After informed consent was obtained the patient was taken to the operating room.She was givenantibiotics. SCDs were in place.She was placed undergeneral anesthesia without complication. Herabdomen was prepped and draped in the standard sterile surgical fashion. A surgical timeout was then performed.  I infiltrated marcainebelow the umbilicus.  I then incised the fascia. This was very thin from a prior surgery. I placed a 0 vicryl pursestring suture. I then inserted a hasson trocar andI insufflated the abdomen to 15 mm Hg pressure. There was no entry injury. An additionalruq, right mid abdomenand epigastric trocar were both placed under direct vision (5 mm). The gallbladder had evidence ofacutecholecystitis.  I grasped the gallbladder and retracted it cephalad and lateral.EventuallyI was able to identify the critical view of safety.I then clipped the duct distally once and proximally twice. The duct was viable.I divided the duct. The clips traversed the duct. I then clipped and divided the cystic artery.I then removed the gallbladder from the liver bed. I placed the gallbladderin a bag and removed from the umbilicus. I obtained hemostasis.  I then removed the hasson trocar, tied down the pursestringand closed this also with several 2-0 vicryl sutures using the PMI device to close the trocar site.I then removed the remaining trocars and these were closed with 4-0 Monocryl and glue. She  tolerated this well be transferred to the recovery room.

## 2018-01-05 NOTE — ED Notes (Signed)
Pt stated she could not urinate at this time. 

## 2018-01-05 NOTE — Transfer of Care (Signed)
Immediate Anesthesia Transfer of Care Note  Patient: Stephanie Cooke  Procedure(s) Performed: LAPAROSCOPIC CHOLECYSTECTOMY (N/A Abdomen)  Patient Location: PACU  Anesthesia Type:General  Level of Consciousness: awake and alert   Airway & Oxygen Therapy: Patient Spontanous Breathing and Patient connected to nasal cannula oxygen  Post-op Assessment: Report given to RN and Post -op Vital signs reviewed and stable  Post vital signs: Reviewed and stable  Last Vitals:  Vitals Value Taken Time  BP 134/77 01/05/2018  5:08 PM  Temp    Pulse 91 01/05/2018  5:08 PM  Resp 16 01/05/2018  5:08 PM  SpO2 93 % 01/05/2018  5:08 PM  Vitals shown include unvalidated device data.  Last Pain:  Vitals:   01/05/18 1001  TempSrc: Oral  PainSc:          Complications: No apparent anesthesia complications

## 2018-01-05 NOTE — Progress Notes (Signed)
This is a 57 year old female with new diagnosed diabetes and acute cholecystitis. Blood sugar is much better with Lantus and sliding scale. I will increase the dose of her Lantus to 10 units daily. Discussed with patient through her son at length. I have also spoken with her in her language. She has been eating a lot of sweets lately. She is visiting here from [place] but has stayed here with her family for the last 1 year. She is processing her permanent residency. She has no plan to go back to [place] anytime soon but has not established primary care. Patient may likely be able to be transitioned to oral hypoglycemics at discharge. She is getting laparoscopic cholecystectomy probably today.

## 2018-01-05 NOTE — Progress Notes (Signed)
Inpatient Diabetes Program Recommendations  AACE/ADA: New Consensus Statement on Inpatient Glycemic Control (2015)  Target Ranges:  Prepandial:   less than 140 mg/dL      Peak postprandial:   less than 180 mg/dL (1-2 hours)      Critically ill patients:  140 - 180 mg/dL   Lab Results  Component Value Date   GLUCAP 148 (H) 01/05/2018    Review of Glycemic Control Results for Stephanie Cooke, Stephanie Cooke (MRN [id number]) as of 01/05/2018 13:47  Ref. Range 01/05/2018 05:48 01/05/2018 07:30 01/05/2018 12:32  Glucose-Capillary Latest Ref Range: 65 - 99 mg/dL 354 (H) 252 (H) 148 (H)   Diabetes history: new onset Outpatient Diabetes medications: none Current orders for Inpatient glycemic control: Lantus 10 units QD, Novolog 0-9 units TID  Inpatient Diabetes Program Recommendations:    Spoke with patient and son regarding possibility of new onset diabetes. Patient's son had many questions related to potential diagnosis. We discussed at length A1C and patient's presentation to the emergency room was reflective of an expected increased A1C value,  which is diagnostic. Explained what a A1c is and what it measures. Also reviewed goal A1c with patient, importance of good glucose control @ home, and blood sugar goals. Informed that an order was placed for an A1C and will let them know as soon as results are obtained. I anticipate that the A1C will be elevated and the patient may require insulin at discharge, will wait for diagnosis.   Educated on pathophysiology of diabetes, need for insulin, how diagnosis can occur in the setting of infection, DKA, vascular changes that occurs with poor glycemic control, symptoms of hyperglycemia, and survival skills.   Introduced insulin pen and insulin syringe. Educated patient and son on insulin pen use at home. Reviewed contents of insulin flexpen starter kit. Reviewed all steps if insulin pen including attachment of needle, 2-unit air shot, dialing up dose, giving injection, removing  needle, disposal of sharps, storage of unused insulin, disposal of insulin etc. Also, demonstrated insulin syringe with vial to include: cleaning vial, opening packing with syringe, drawing up medication, verifying dosage, cleaning patient's skin, injecting medication, and proper disposal. The patient and son want to think about options of both. Will plan to return for additional education once A1C is resulted. Encouraged son and patient to review information and formulate additional questions.  In the event of, patient will need a meter and testing supplies at discharge. Blood glucose meter and kit (includes lancets and strips) ([id number])  Thanks, Bronson Curb, MSN, RNC-OB Diabetes Coordinator [phone] (8a-5p)

## 2018-01-05 NOTE — Anesthesia Procedure Notes (Signed)
Procedure Name: Intubation Date/Time: 01/05/2018 4:02 PM Performed by: Marny Lowensteinapozzi, Shima Compere W, CRNA Pre-anesthesia Checklist: Patient identified, Emergency Drugs available, Suction available, Patient being monitored and Timeout performed Patient Re-evaluated:Patient Re-evaluated prior to induction Oxygen Delivery Method: Circle system utilized Preoxygenation: Pre-oxygenation with 100% oxygen Induction Type: IV induction Ventilation: Mask ventilation without difficulty Laryngoscope Size: Miller and 2 Grade View: Grade II Tube type: Oral Tube size: 7.0 mm Number of attempts: 1 Placement Confirmation: ETT inserted through vocal cords under direct vision,  positive ETCO2,  CO2 detector and breath sounds checked- equal and bilateral Secured at: 20 cm Tube secured with: Tape Dental Injury: Teeth and Oropharynx as per pre-operative assessment

## 2018-01-05 NOTE — Progress Notes (Signed)
Patient transported to short stay for upcoming surgery.  Son at bedside to translate.  Report given to [facility].

## 2018-01-06 ENCOUNTER — Encounter (HOSPITAL_COMMUNITY): Payer: Self-pay | Admitting: General Surgery

## 2018-01-06 LAB — CBC
HCT: 42.1 % (ref 36.0–46.0)
HEMOGLOBIN: 14.1 g/dL (ref 12.0–15.0)
MCH: 28.8 pg (ref 26.0–34.0)
MCHC: 33.5 g/dL (ref 30.0–36.0)
MCV: 85.9 fL (ref 78.0–100.0)
Platelets: 243 10*3/uL (ref 150–400)
RBC: 4.9 MIL/uL (ref 3.87–5.11)
RDW: 13.4 % (ref 11.5–15.5)
WBC: 9.1 10*3/uL (ref 4.0–10.5)

## 2018-01-06 LAB — BASIC METABOLIC PANEL
ANION GAP: 9 (ref 5–15)
BUN: 12 mg/dL (ref 6–20)
CALCIUM: 8.7 mg/dL — AB (ref 8.9–10.3)
CO2: 22 mmol/L (ref 22–32)
Chloride: 103 mmol/L (ref 101–111)
Creatinine, Ser: 0.76 mg/dL (ref 0.44–1.00)
GFR calc non Af Amer: >60 mL/min (ref >60)
Glucose, Bld: 329 mg/dL — ABNORMAL HIGH (ref 65–99)
Potassium: 3.5 mmol/L (ref 3.5–5.1)
Sodium: 134 mmol/L — ABNORMAL LOW (ref 135–145)

## 2018-01-06 LAB — COMPREHENSIVE METABOLIC PANEL
ALT: 46 U/L (ref 14–54)
ANION GAP: 12 (ref 5–15)
AST: 56 U/L — ABNORMAL HIGH (ref 15–41)
Albumin: 3.3 g/dL — ABNORMAL LOW (ref 3.5–5.0)
Alkaline Phosphatase: 104 U/L (ref 38–126)
BUN: 11 mg/dL (ref 6–20)
CALCIUM: 8.7 mg/dL — AB (ref 8.9–10.3)
CHLORIDE: 102 mmol/L (ref 101–111)
CO2: 22 mmol/L (ref 22–32)
Creatinine, Ser: 0.64 mg/dL (ref 0.44–1.00)
GFR calc non Af Amer: >60 mL/min (ref >60)
Glucose, Bld: 308 mg/dL — ABNORMAL HIGH (ref 65–99)
POTASSIUM: 3.5 mmol/L (ref 3.5–5.1)
SODIUM: 136 mmol/L (ref 135–145)
Total Bilirubin: 0.9 mg/dL (ref 0.3–1.2)
Total Protein: 6.4 g/dL — ABNORMAL LOW (ref 6.5–8.1)

## 2018-01-06 LAB — GLUCOSE, CAPILLARY
GLUCOSE-CAPILLARY: 157 mg/dL — AB (ref 65–99)
GLUCOSE-CAPILLARY: 180 mg/dL — AB (ref 65–99)
Glucose-Capillary: 281 mg/dL — ABNORMAL HIGH (ref 65–99)
Glucose-Capillary: 289 mg/dL — ABNORMAL HIGH (ref 65–99)

## 2018-01-06 LAB — MAGNESIUM: MAGNESIUM: 1.8 mg/dL (ref 1.7–2.4)

## 2018-01-06 LAB — HEMOGLOBIN A1C
Hgb A1c MFr Bld: 11.7 % — ABNORMAL HIGH (ref 4.8–5.6)
Mean Plasma Glucose: 289.09 mg/dL

## 2018-01-06 LAB — LIPID PANEL
CHOL/HDL RATIO: 4 ratio
Cholesterol: 207 mg/dL — ABNORMAL HIGH (ref 0–200)
HDL: 52 mg/dL (ref >40)
LDL CALC: 142 mg/dL — AB (ref 0–99)
Triglycerides: 66 mg/dL (ref <150)
VLDL: 13 mg/dL (ref 0–40)

## 2018-01-06 MED ORDER — INSULIN GLARGINE 100 UNIT/ML ~~LOC~~ SOLN
10.0000 [IU] | Freq: Two times a day (BID) | SUBCUTANEOUS | Status: DC
  Administered 2018-01-06 – 2018-01-07 (×2): 10 [IU] via SUBCUTANEOUS
  Filled 2018-01-06 (×2): qty 0.1

## 2018-01-06 MED ORDER — OXYCODONE-ACETAMINOPHEN 5-325 MG PO TABS: 1.0000 | ORAL_TABLET | Freq: Four times a day (QID) | ORAL | 0 refills | Status: DC | PRN

## 2018-01-06 MED ORDER — ENOXAPARIN SODIUM 40 MG/0.4ML ~~LOC~~ SOLN
40.0000 mg | SUBCUTANEOUS | Status: DC
  Administered 2018-01-06 – 2018-01-07 (×2): 40 mg via SUBCUTANEOUS
  Filled 2018-01-06 (×2): qty 0.4

## 2018-01-06 NOTE — Care Management Note (Signed)
Case Management Note  Patient Details  Name: Stephanie Cooke MRN: [id number] Date of Birth: 02/01/1961  Subjective/Objective:                    Action/Plan:  Provided and explained MATCH letter and follow up appointment information with son at bedside . Son voiced understanding. Expected Discharge Date:                  Expected Discharge Plan:  Home/Self Care  In-House Referral:  Financial Counselor  Discharge planning Services  CM Consult, [facility], [facility], Medication Assistance  Post Acute Care Choice:  NA Choice offered to:  Adult Children  DME Arranged:  N/A DME Agency:     HH Arranged:  NA HH Agency:  NA  Status of Service:  Completed, signed off  If discussed at Long Length of Stay Meetings, dates discussed:    Additional Comments:  Kingsley PlanWile, Randie Bloodgood Marie, RN 01/06/2018, 3:58 PM

## 2018-01-06 NOTE — Progress Notes (Signed)
[facility] Progress Note  1 Day Post-Op  Subjective: CC-  Son at bedside who helped with translating. Patient complains of no abdominal pain this morning. States that she feels much better than prior to surgery. Tolerating diet. Denies n/v. She has not ambulated yet since surgery.   Objective: Vital signs in last 24 hours: Temp:  [97.7 F (36.5 C)-98.9 F (37.2 C)] 98.3 F (36.8 C) (04/05 0525) Pulse Rate:  [75-102] 75 (04/05 0525) Resp:  [15-16] 16 (04/05 0525) BP: (113-143)/(67-88) 113/67 (04/05 0525) SpO2:  [93 %-98 %] 97 % (04/05 0525) Weight:  [129 lb 10.1 oz (58.8 kg)] 129 lb 10.1 oz (58.8 kg) (04/04 1431) Last BM Date: (PTA)  Intake/Output from previous day: 04/04 0701 - 04/05 0700 In: 3437.5 [I.V.:3387.5; IV Piggyback:50] Out: 0  Intake/Output this shift: Total I/O In: 668.8 [P.O.:240; I.V.:278.8; IV Piggyback:150] Out: -   PE: Gen:  Alert, NAD, pleasant HEENT: EOM's intact, pupils equal and round Pulm:  effort normal Abd: Soft, NT, +BS, lap incisions C/D/I with steris intact  Lab Results:  Recent Labs    01/04/18 2221 01/06/18 0726  WBC 20.8* 9.1  HGB 16.0* 14.1  HCT 47.4* 42.1  PLT 304 243   BMET Recent Labs    01/04/18 2221 01/06/18 0726  NA 134* 136  K 3.2* 3.5  CL 98* 102  CO2 22 22  GLUCOSE 422* 308*  BUN 15 11  CREATININE 0.83 0.64  CALCIUM 10.0 8.7*   PT/INR Recent Labs    01/05/18 0555  LABPROT 14.1  INR 1.09   CMP     Component Value Date/Time   NA 136 01/06/2018 0726   K 3.5 01/06/2018 0726   CL 102 01/06/2018 0726   CO2 22 01/06/2018 0726   GLUCOSE 308 (H) 01/06/2018 0726   BUN 11 01/06/2018 0726   CREATININE 0.64 01/06/2018 0726   CALCIUM 8.7 (L) 01/06/2018 0726   PROT 6.4 (L) 01/06/2018 0726   ALBUMIN 3.3 (L) 01/06/2018 0726   AST 56 (H) 01/06/2018 0726   ALT 46 01/06/2018 0726   ALKPHOS 104 01/06/2018 0726   BILITOT 0.9 01/06/2018 0726   GFRNONAA >60 01/06/2018 0726   GFRAA >60 01/06/2018 0726    Lipase     Component Value Date/Time   LIPASE 30 01/04/2018 2221       Studies/Results: [place] Abdomen Limited Ruq  Result Date: 01/05/2018 CLINICAL DATA:  Epigastric pain. EXAM: ULTRASOUND ABDOMEN LIMITED RIGHT UPPER QUADRANT COMPARISON:  None. FINDINGS: Gallbladder: There is a large mobile stone in the gallbladder measuring about 2.8 cm diameter. Mild pericholecystic edema with mild wall thickening at 4.2 mm. Murphy's sign is negative. Common bile duct: Diameter: 8.2 mm, dilated. No intraluminal stones are demonstrated but the distal duct is obscured by bowel gas. Liver: Diffusely increased hepatic parenchymal echotexture likely indicating fatty infiltration. No focal lesions identified. Portal vein is patent on color Doppler imaging with normal direction of blood flow towards the liver. IMPRESSION: Cholelithiasis with gallbladder wall thickening and edema suggestive of cholecystitis. Murphy's sign is negative. Mild bile duct dilatation. Fatty infiltration of the liver. Electronically Signed   By: Burman NievesWilliam  Stevens M.D.   On: 01/05/2018 04:18    Anti-infectives: Anti-infectives (From admission, onward)   Start     Dose/Rate Route Frequency Ordered Stop   01/05/18 0600  piperacillin-tazobactam (ZOSYN) IVPB 3.375 g     3.375 g 12.5 mL/hr over 240 Minutes Intravenous Every 8 hours 01/05/18 0529  Assessment/Plan New type 2 DM - A1c 11.7 Elevated hCG in postmenopausal woman  Acute cholecystitis S/p Laparoscopic cholecystectomy 4/4 Dr. Dwain Sarna - POD 1 - bilirubin WNL  ID - zosyn 4/4>>4/5 FEN - CM/HH diet VTE - SCDs, lovenox Foley - none Follow up - [facility] clinic 2-3 weeks  Plan - Patient needs to ambulate, but otherwise ready for discharge from surgical standpoint if her pain remains under control on oral medications. I will print a rx for pain medication and place on chart. F/u information and discharge instructions on AVS.  Follow up with PCP regarding new diagnosis DM and  elevated hCG.   LOS: 1 day    Franne Forts , [facility] 01/06/2018, 10:20 AM Pager: [phone] Consults: [phone] Mon-Fri 7:00 am-4:30 pm Sat-Sun 7:00 am-11:30 am

## 2018-01-06 NOTE — Progress Notes (Addendum)
Inpatient Diabetes Program Recommendations  AACE/ADA: New Consensus Statement on Inpatient Glycemic Control (2015)  Target Ranges:  Prepandial:   less than 140 mg/dL      Peak postprandial:   less than 180 mg/dL (1-2 hours)      Critically ill patients:  140 - 180 mg/dL   Lab Results  Component Value Date   GLUCAP 281 (H) 01/06/2018    Review of Glycemic Control Results for Stephanie Cooke, Stephanie Cooke (MRN [id number]) as of 01/06/2018 08:44  Ref. Range 01/05/2018 14:29 01/05/2018 18:00 01/05/2018 21:34 01/06/2018 07:57  Glucose-Capillary Latest Ref Range: 65 - 99 mg/dL 131 (H) 230 (H) 315 (H) 281 (H)   Diabetes history: New onset Outpatient Diabetes medications: none Current orders for Inpatient glycemic control: Novolog 0-9 units TID, Lantus 10 units QD  Inpatient Diabetes Program Recommendations:    Noted patient received 1 dose of Decadron 10 mg on 4/4, during procedure and that patient did not received scheduled doses of correction. Thus, contributing to AM BS that are increased. Will continue to current scheduled doses of insulin today and watch trends.  Awaiting results of A1C and will plan to see patient today.    Addendum @ 61- Spoke with patient and son extensively regarding new diagnosis of diabetes. Reviewed patient's current A1c of 11.7%. Explained what a A1c is and what it measures. Also reviewed goal A1c with patient, importance of good glucose control @ home, and blood sugar goals.  Patient will need a meter and supplies at discharge. Assuming that [facility] will provide this for the patient, if not the patient's son has purchasing information at [employer]: [employer]. Blood glucose meter and kit (includes lancets and strips) ([id number]) Encouraged son to check twice daily, usually prior to administering insulin. We also discussed writing BS in a diary and taking them to the appointment at the [facility] clinic, as this will help the provider with any insulin adjustments.  Reviewed vial and  syringe with patient and son. Patient's son will be the only person who will be administering insulin to his mother. Therefore, Novolin 70/30 would be a recommendation, in that the son can administer in the AM/PM around his schedule. Information provided on [employer]- Novolin 70/30 $25 per vial. Demonstrated insulin syringe with vial again to include: cleaning vial, opening packing with syringe, drawing up medication, verifying dosage, cleaning patient's skin, injecting medication, and proper disposal. Son was confidently able to demonstrate.   Reviewed survival skills with son and patient. Wanted the patient to have a good understanding of interventions related to hypoglycemia, sign and symptoms and a plan of who to contact in the event her son was not available. Son verified that the patient was able to verbalize and felt comfortable leaving her; his wife would be in the home with her. At this time patient and son have no further questions related to diabetes.    Thanks, Bronson Curb, MSN, RNC-OB Diabetes Coordinator [phone] (8a-5p)\

## 2018-01-06 NOTE — Discharge Instructions (Signed)

## 2018-01-06 NOTE — Plan of Care (Signed)
  RD consulted for nutrition education regarding diabetes.   Lab Results  Component Value Date   HGBA1C 11.7 (H) 01/06/2018   Pt diagnosed with DM during this admission. Case discussed with RN prior to visit, who reports that pt speaks an Arabic dialect that is not available through interpretive services. Pt son is very attentive and at bedside, and has been assisting with communication.   Spoke with pt son, who reports pt has a great appetite. Pt and family consumes 3 meals per day and always make a point to eat together at each meal. Meals are heavy in protein (mainly chicken, [place], and lamb) and non-starchy vegetables. Pt rarely snacks, but usually snacks on fruits and vegetables. Commonly consumed beverages are water, black coffee, or tea mixed with powered milk. Pt rarely consumes candy or soda. Pt and son's wife do the majority of the cooking for their household.   Pt son was able to teach back basic principles of diabetes self-management skills (discussed earlier with DM coordinator), including blood sugar goals, as well as signs and symptoms and treatment of hypoglycemic episodes. Pt son expressed some anxiety about managing CBGS and medications at home; assured him that he would receive education and information regarding medications at discharge. Also discussed survival skills (regular meal intake, importance of taking medications and monitoring blood sugars, and regular followed up with PCP) to best optimize glycemic control.   Focus on education was on the plate method, portion sizes, and review of basic diabetes self-management skills.   RD provided "Carbohydrate Counting for People with Diabetes" handout from the Academy of Nutrition and Dietetics. Discussed different food groups and their effects on blood sugar, emphasizing carbohydrate-containing foods. Provided list of carbohydrates and recommended serving sizes of common foods.  Discussed importance of controlled and consistent  carbohydrate intake throughout the day. Provided examples of ways to balance meals/snacks and encouraged intake of high-fiber, whole grain complex carbohydrates. Teach back method used.  Expect good compliance.  Body mass index is 22.25 kg/m. Pt meets criteria for normal weight range based on current BMI.  Current diet order is Carb Modified, patient is consuming approximately 100% of meals at this time. Labs and medications reviewed. No further nutrition interventions warranted at this time. RD contact information provided. If additional nutrition issues arise, please re-consult RD.  Wilhemina Grall A. Mayford KnifeWilliams, RD, LDN, CDE Pager: [phone] After hours Pager: [phone]

## 2018-01-06 NOTE — Progress Notes (Signed)
Patient ID: Stephanie Cooke, female   DOB: 09/23/1961, 57 y.o.   MRN: [id number]  PROGRESS NOTE    Stephanie Cooke  XBJ:[id number] DOB: 12/07/1960 DOA: 01/04/2018 PCP: Patient, No Pcp Per   Outpatient Specialists: None   Brief Narrative:  This is a 57 year old female with new diagnosed diabetes and acute cholecystitis. Blood sugar is much better with Lantus and sliding scale. I will increase the dose of her Lantus to 10 units daily. Discussed with patient through her son at length. I have also spoken with her in her language. She has been eating a lot of sweets lately. She is visiting here from [place] but has stayed here with her family for the last 1 year. She is processing her permanent residency. She has no plan to go back to [place] anytime soon but has not established primary care. She is S/P Lap chole yesterday but blood sugar has remained elevated.    Assessment & Plan:   Principal Problem:   Acute cholecystitis Active Problems:   Sepsis (HCC)   New onset type 2 diabetes mellitus (HCC)   Hypokalemia   1. Acute cholecystitis: S/P Lap chole yesterday doing better. Clinically doing better continue antibiotics.   2. Sepsis: Resolved.   3. DM2: Uncontrolled. Will increase Lantus dose to 10 units SQ BID and SSI. May be Discharged on Combination of lantus and Metformin. Set up new PCP  4. Hypokalemia: Repleted.   DVT prophylaxis: SCD Code Status: Full Family Communication: Son at Bedside Disposition Plan: home   Consultants:   Emelia LoronWakefield, Matthew  Procedures:  -Lap chole  Antimicrobials:  -None  Subjective: Patient doing better but sugar still high  Objective: Vitals:   01/05/18 1723 01/05/18 1738 01/05/18 2117 01/06/18 0525  BP: 136/73 139/84 (!) 143/88 113/67  Pulse: 91 93 (!) 102 75  Resp: 15 15 16 16   Temp:  97.7 F (36.5 C) 98.9 F (37.2 C) 98.3 F (36.8 C)  TempSrc:   Oral Oral  SpO2: 94% 96% 98% 97%  Weight:      Height:        Intake/Output Summary (Last  24 hours) at 01/06/2018 1038 Last data filed at 01/06/2018 0925 Gross per 24 hour  Intake 3106.25 ml  Output 0 ml  Net 3106.25 ml   Filed Weights   01/05/18 1431  Weight: 58.8 kg (129 lb 10.1 oz)    Examination:  General exam: Appears calm and comfortable  Respiratory system: Clear to auscultation. Respiratory effort normal. Cardiovascular system: S1 & S2 heard, RRR. No JVD, murmurs, rubs, gallops or clicks. No pedal edema. Gastrointestinal system: Abdomen is nondistended, soft and nontender. No organomegaly or masses felt. Normal bowel sounds heard. Central nervous system: Alert and oriented. No focal neurological deficits. Extremities: Symmetric 5 x 5 power. Skin: No rashes, lesions or ulcers Psychiatry: Judgement and insight appear normal. Mood & affect appropriate.     Data Reviewed: I have personally reviewed following labs and imaging studies  CBC: Recent Labs  Lab 01/04/18 2221 01/06/18 0726  WBC 20.8* 9.1  HGB 16.0* 14.1  HCT 47.4* 42.1  MCV 84.0 85.9  PLT 304 243   Basic Metabolic Panel: Recent Labs  Lab 01/04/18 2221 01/06/18 0726  NA 134* 136  K 3.2* 3.5  CL 98* 102  CO2 22 22  GLUCOSE 422* 308*  BUN 15 11  CREATININE 0.83 0.64  CALCIUM 10.0 8.7*  MG  --  1.8   GFR: Estimated Creatinine Clearance: 65.4 mL/min (by C-G formula based  on SCr of 0.64 mg/dL). Liver Function Tests: Recent Labs  Lab 01/04/18 2221 01/06/18 0726  AST 37 56*  ALT 24 46  ALKPHOS 136* 104  BILITOT 0.8 0.9  PROT 8.2* 6.4*  ALBUMIN 4.4 3.3*   Recent Labs  Lab 01/04/18 2221  LIPASE 30   No results for input(s): AMMONIA in the last 168 hours. Coagulation Profile: Recent Labs  Lab 01/05/18 0555  INR 1.09   Cardiac Enzymes: No results for input(s): CKTOTAL, CKMB, CKMBINDEX, TROPONINI in the last 168 hours. BNP (last 3 results) No results for input(s): PROBNP in the last 8760 hours. HbA1C: Recent Labs    01/06/18 0726  HGBA1C 11.7*   CBG: Recent Labs  Lab  01/05/18 1232 01/05/18 1429 01/05/18 1800 01/05/18 2134 01/06/18 0757  GLUCAP 148* 131* 230* 315* 281*   Lipid Profile: Recent Labs    01/06/18 0726  CHOL 207*  HDL 52  LDLCALC 142*  TRIG 66  CHOLHDL 4.0   Thyroid Function Tests: No results for input(s): TSH, T4TOTAL, FREET4, T3FREE, THYROIDAB in the last 72 hours. Anemia Panel: No results for input(s): VITAMINB12, FOLATE, FERRITIN, TIBC, IRON, RETICCTPCT in the last 72 hours. Urine analysis:    Component Value Date/Time   COLORURINE YELLOW 01/05/2018 0538   APPEARANCEUR CLEAR 01/05/2018 0538   LABSPEC 1.035 (H) 01/05/2018 0538   PHURINE 5.0 01/05/2018 0538   GLUCOSEU >=500 (A) 01/05/2018 0538   HGBUR SMALL (A) 01/05/2018 0538   BILIRUBINUR NEGATIVE 01/05/2018 0538   KETONESUR 20 (A) 01/05/2018 0538   PROTEINUR 30 (A) 01/05/2018 0538   NITRITE NEGATIVE 01/05/2018 0538   LEUKOCYTESUR NEGATIVE 01/05/2018 0538   Sepsis Labs: @LABRCNTIP (procalcitonin:4,lacticidven:4)  )No results found for this or any previous visit (from the past 240 hour(s)).       Radiology Studies: US Abdomen Limited Ruq  Result Date: 01/05/2018 CLINICAL DATA:  Epigastric pain. EXAM: ULTRASOUND ABDOMEN LIMITED RIGHT UPPER QUADRANT COMPARISON:  None. FINDINGS: Gallbladder: There is a large mobile stone in the gallbladder measuring about 2.8 cm diameter. Mild pericholecystic edema with mild wall thickening at 4.2 mm. Murphy's sign is negative. Common bile duct: Diameter: 8.2 mm, dilated. No intraluminal stones are demonstrated but the distal duct is obscured by bowel gas. Liver: Diffusely increased hepatic parenchymal echotexture likely indicating fatty infiltration. No focal lesions identified. Portal vein is patent on color Doppler imaging with normal direction of blood flow towards the liver. IMPRESSION: Cholelithiasis with gallbladder wall thickening and edema suggestive of cholecystitis. Murphy's sign is negative. Mild bile duct dilatation. Fatty  infiltration of the liver. Electronically Signed   By: Burman Nieves M.D.   On: 01/05/2018 04:18        Scheduled Meds: . enoxaparin (LOVENOX) injection  40 mg Subcutaneous Q24H  . insulin aspart  0-9 Units Subcutaneous TID WC  . insulin glargine  10 Units Subcutaneous BID   Continuous Infusions: . sodium chloride 75 mL/hr at 01/05/18 1800     LOS: 1 day    Time spent: 32 minutes    GARBA,LAWAL, MD [facility] Pager [phone] If 7PM-7AM, please contact night-coverage [email or web address] Password [facility] 01/06/2018, 10:38 AM

## 2018-01-07 LAB — GLUCOSE, CAPILLARY
GLUCOSE-CAPILLARY: 107 mg/dL — AB (ref 65–99)
GLUCOSE-CAPILLARY: 148 mg/dL — AB (ref 65–99)

## 2018-01-07 MED ORDER — GLIMEPIRIDE 2 MG PO TABS: 2.0000 mg | ORAL_TABLET | ORAL | 11 refills | Status: DC

## 2018-01-07 MED ORDER — ACETAMINOPHEN 325 MG PO TABS: 650.0000 mg | ORAL_TABLET | Freq: Four times a day (QID) | ORAL | 0 refills | Status: DC | PRN

## 2018-01-07 MED ORDER — ONDANSETRON HCL 4 MG PO TABS: 4.0000 mg | ORAL_TABLET | Freq: Every day | ORAL | 1 refills | Status: DC | PRN

## 2018-01-07 NOTE — Progress Notes (Signed)
2 Days Post-Op  Subjective:  She is doing well.  She has no pain.  Denies nausea or vomiting. She is ready to go home from a surgical standpoint once cleared medically  Blood sugars ranged from 157-289.  Being managed by medicine  Objective: Vital signs in last 24 hours: Temp:  [98.3 F (36.8 C)-99.6 F (37.6 C)] 98.3 F (36.8 C) (04/06 0519) Pulse Rate:  [74-88] 74 (04/06 0519) Resp:  [16-17] 17 (04/06 0519) BP: (114-132)/(62-72) 122/64 (04/06 0519) SpO2:  [95 %-100 %] 96 % (04/06 0519) Last BM Date: (PTA)  Intake/Output from previous day: 04/05 0701 - 04/06 0700 In: 2235 [P.O.:420; I.V.:1665; IV Piggyback:150] Out: -  Intake/Output this shift: No intake/output data recorded.    PE: Gen:  Alert, NAD, pleasant.  No distress and looks well. HEENT: EOM's intact, pupils equal and round Pulm:  effort normal Abd: Soft, NT, +BS, lap incisions C/D/I with steris intact     Lab Results:  Results for orders placed or performed during the hospital encounter of 01/04/18 (from the past 24 hour(s))  Basic metabolic panel     Status: Abnormal   Collection Time: 01/06/18 10:29 AM  Result Value Ref Range   Sodium 134 (L) 135 - 145 mmol/L   Potassium 3.5 3.5 - 5.1 mmol/L   Chloride 103 101 - 111 mmol/L   CO2 22 22 - 32 mmol/L   Glucose, Bld 329 (H) 65 - 99 mg/dL   BUN 12 6 - 20 mg/dL   Creatinine, Ser 3.240.76 0.44 - 1.00 mg/dL   Calcium 8.7 (L) 8.9 - 10.3 mg/dL   GFR calc non Af Amer >60 >60 mL/min   GFR calc Af Amer >60 >60 mL/min   Anion gap 9 5 - 15  Glucose, capillary     Status: Abnormal   Collection Time: 01/06/18 11:38 AM  Result Value Ref Range   Glucose-Capillary 289 (H) 65 - 99 mg/dL  Glucose, capillary     Status: Abnormal   Collection Time: 01/06/18  4:55 PM  Result Value Ref Range   Glucose-Capillary 157 (H) 65 - 99 mg/dL  Glucose, capillary     Status: Abnormal   Collection Time: 01/06/18 10:22 PM  Result Value Ref Range   Glucose-Capillary 180 (H) 65 - 99  mg/dL     Studies/Results: No results found.  . enoxaparin (LOVENOX) injection  40 mg Subcutaneous Q24H  . insulin aspart  0-9 Units Subcutaneous TID WC  . insulin glargine  10 Units Subcutaneous BID     Assessment/Plan: s/p Procedure(s): LAPAROSCOPIC CHOLECYSTECTOMY  New type 2 DM - A1c 11.7 Elevated hCG in postmenopausal woman  Acute cholecystitis S/p Laparoscopic cholecystectomy 4/4 Dr. Dwain SarnaWakefield - POD 2 - bilirubin WNL  ID - zosyn 4/4>>4/5 FEN - CM/HH diet VTE - SCDs, lovenox Foley - none Follow up - [facility] clinic 2-3 weeks  Plan -  Patient  ready for discharge from surgical standpoint  . I will print a rx for pain medication and place on chart.  F/u information and discharge instructions on AVS.  Follow up with PCP regarding new diagnosis DM and elevated hCG.     @PROBHOSP @  LOS: 2 days    Stephanie MentionHaywood M Daneisha Cooke 01/07/2018  . .prob

## 2018-01-07 NOTE — Discharge Summary (Addendum)
Stephanie Cooke, is a 57 y.o. female  DOB [birth year]  MRN [id number].  Admission date:  01/04/2018  Admitting Physician  Lorretta Harp, MD  Discharge Date:  01/07/2018   Primary MD  Patient, No Pcp Per  Recommendations for primary care physician for things to follow:   Acute cholecystitis S/p Lap Chole Please f/u with general surgery in 1-2 weeks  Dm2 (new onset) (Hga1c11.7) Start Amaryl 2mg  po qday Please f/u with [facility] 01/31/2018 at 3pm     Admission Diagnosis  Acute cholecystitis [K81.0] Epigastric pain [R10.13] Hyperglycemia [R73.9]   Discharge Diagnosis  Acute cholecystitis [K81.0] Epigastric pain [R10.13] Hyperglycemia [R73.9]      Principal Problem:   Acute cholecystitis Active Problems:   Sepsis (HCC)   New onset type 2 diabetes mellitus (HCC)   Hypokalemia      History reviewed. No pertinent past medical history.  Past Surgical History:  Procedure Laterality Date  . CHOLECYSTECTOMY N/A 01/05/2018   Procedure: LAPAROSCOPIC CHOLECYSTECTOMY;  Surgeon: Emelia Loron, MD;  Location: [facility] OR;  Service: General;  Laterality: N/A;       HPI  from the history and physical done on the day of admission:    57 y.o. female without significant medical history, who presents with nausea, vomiting, diarrhea and abdominal pain.  Pt speaks arabic language, does not understand [place]. History is obtained through translator and partially with help of her son. Patient states that her symptoms started yesterday. She has nausea, vomiting, diarrhea, abdominal pain. She vomited 4 times and had for loose stool bowel movement today. Her abdominal pain is located in the right upper quadrant, intermittent, sharp, nonradiating. Patient does not have fever or chills. She denies chest pain, shortness of breath, cough symptoms of UTI. No unilateral weakness. She states that she  occasionally takes ibuprofen or Tylenol for mild headache. No history of DM.   ED Course: pt was found to have WBC 20.8, POC bHCG 15.3, potassium 3.2, normal LFT, lipase normal, creatinine normal, tachycardia, tachypnea, oxygen 97% on room air. Abdominal ultrasound showed cholecystitis and common bile duct dilation 8.2 mm. Pt is admitted to MedSurg bed as inpatient. Gen. Surgeon, Dr. Cliffton Asters was consulted.        Hospital Course:     Pt admitted and general surgery consulted.  pt went for Lap Chole on 01/05/2018,  Pt was Dx w new onset Dm2 (Hga1c=11.7),  Spoke with family and this is a new diagnosis, and pt will need to follow up with new PCP for additional medication.  Pt is currently without pain,  Has slight nausea,  Afebrile.  Tolerating solid food.  Pt feels stable and would like to be discharge home today.    Follow UP  Follow-up Information    [facility] Follow up.   Why:  Follow up hospital appointment January 31, 2018 at [facility]  Contact information: [address] [place] [address] [phone]       [facility], [place].  Call.   Specialty:  General Surgery Why:  We are working on your appointment, please call to confirm. Please arrive 30 minutes prior to your appointment to check in and fill out paperwork. Bring photo ID and insurance information. Contact information: [address] [place] [address] [phone]           Consults obtained - surgery  Discharge Condition: stable  Diet and Activity recommendation: See Discharge Instructions below  Discharge Instructions        Discharge Medications     Allergies as of 01/07/2018   No Known Allergies     Medication List    STOP taking these medications   ibuprofen 200 MG tablet Commonly known as:  ADVIL,MOTRIN     TAKE these medications   acetaminophen 325 MG tablet Commonly known as:  TYLENOL Take 2 tablets (650 mg  total) by mouth every 6 (six) hours as needed for fever, headache or moderate pain.   glimepiride 2 MG tablet Commonly known as:  AMARYL Take 1 tablet (2 mg total) by mouth every morning.   ondansetron 4 MG tablet Commonly known as:  ZOFRAN Take 1 tablet (4 mg total) by mouth daily as needed for nausea or vomiting.   oxyCODONE-acetaminophen 5-325 MG tablet Commonly known as:  PERCOCET/ROXICET Take 1 tablet by mouth every 6 (six) hours as needed for severe pain.       Major procedures and Radiology Reports - PLEASE review detailed and final reports for all details, in brief -      US Abdomen Limited Ruq  Result Date: 01/05/2018 CLINICAL DATA:  Epigastric pain. EXAM: ULTRASOUND ABDOMEN LIMITED RIGHT UPPER QUADRANT COMPARISON:  None. FINDINGS: Gallbladder: There is a large mobile stone in the gallbladder measuring about 2.8 cm diameter. Mild pericholecystic edema with mild wall thickening at 4.2 mm. Murphy's sign is negative. Common bile duct: Diameter: 8.2 mm, dilated. No intraluminal stones are demonstrated but the distal duct is obscured by bowel gas. Liver: Diffusely increased hepatic parenchymal echotexture likely indicating fatty infiltration. No focal lesions identified. Portal vein is patent on color Doppler imaging with normal direction of blood flow towards the liver. IMPRESSION: Cholelithiasis with gallbladder wall thickening and edema suggestive of cholecystitis. Murphy's sign is negative. Mild bile duct dilatation. Fatty infiltration of the liver. Electronically Signed   By: Burman Nieves M.D.   On: 01/05/2018 04:18    Micro Results     Recent Results (from the past 240 hour(s))  Culture, blood (Routine X 2) w Reflex to ID Panel     Status: None (Preliminary result)   Collection Time: 01/05/18  5:30 AM  Result Value Ref Range Status   Specimen Description BLOOD LEFT ANTECUBITAL  Final   Special Requests   Final    BOTTLES DRAWN AEROBIC AND ANAEROBIC Blood Culture  adequate volume   Culture   Final    NO GROWTH 1 DAY Performed at [facility], [address] [place], [place] [address]    Report Status PENDING  Incomplete  Culture, blood (Routine X 2) w Reflex to ID Panel     Status: None (Preliminary result)   Collection Time: 01/05/18  6:00 AM  Result Value Ref Range Status   Specimen Description BLOOD RIGHT ANTECUBITAL  Final   Special Requests   Final    BOTTLES DRAWN AEROBIC AND ANAEROBIC Blood Culture adequate volume   Culture   Final    NO GROWTH 1 DAY Performed at  [facility], [address] [place], [place] [address]    Report Status PENDING  Incomplete       Today   Subjective    Stephanie Cooke today is tolerating food.  No abdominal pain.    no headache,no chest pain, ,no new weakness tingling or numbness, feels much better wants to go home today.   Objective   Blood pressure 122/64, pulse 74, temperature 98.3 F (36.8 C), temperature source Oral, resp. rate 17, height 5\' 4"  (1.626 m), weight 58.8 kg (129 lb 10.1 oz), SpO2 96 %.   Intake/Output Summary (Last 24 hours) at 01/07/2018 1042 Last data filed at 01/07/2018 0600 Gross per 24 hour  Intake 1566.25 ml  Output -  Net 1566.25 ml    Exam Awake Alert, Oriented x 3, No new F.N deficits, Normal affect [place].AT,PERRAL Supple Neck,No JVD, No cervical lymphadenopathy appriciated.  Symmetrical Chest wall movement, Good air movement bilaterally, CTAB RRR,No Gallops,Rubs or new Murmurs, No Parasternal Heave +ve B.Sounds, Abd Soft, Non tender, No organomegaly appriciated, No rebound -guarding or rigidity. No Cyanosis, Clubbing or edema, No new Rash or bruise   Data Review   CBC w Diff:  Lab Results  Component Value Date   WBC 9.1 01/06/2018   HGB 14.1 01/06/2018   HCT 42.1 01/06/2018   PLT 243 01/06/2018    CMP:  Lab Results  Component Value Date   NA 134 (L) 01/06/2018   K 3.5 01/06/2018   CL 103 01/06/2018   CO2 22 01/06/2018   BUN 12 01/06/2018    CREATININE 0.76 01/06/2018   PROT 6.4 (L) 01/06/2018   ALBUMIN 3.3 (L) 01/06/2018   BILITOT 0.9 01/06/2018   ALKPHOS 104 01/06/2018   AST 56 (H) 01/06/2018   ALT 46 01/06/2018  .   Total Time in preparing paper work, data evaluation and todays exam - 35 minutes  Pearson GrippeJames Nikira Kushnir M.D on 01/07/2018 at 10:42 AM  [facility]   Office  [phone]

## 2018-01-09 NOTE — Anesthesia Postprocedure Evaluation (Signed)
Anesthesia Post Note  Patient: Stephanie Cooke  Procedure(s) Performed: LAPAROSCOPIC CHOLECYSTECTOMY (N/A Abdomen)     Patient location during evaluation: PACU Anesthesia Type: General Level of consciousness: awake and alert Pain management: pain level controlled Vital Signs Assessment: post-procedure vital signs reviewed and stable Respiratory status: spontaneous breathing, nonlabored ventilation, respiratory function stable and patient connected to nasal cannula oxygen Cardiovascular status: blood pressure returned to baseline and stable Postop Assessment: no apparent nausea or vomiting Anesthetic complications: no    Last Vitals:  Vitals:   01/06/18 2224 01/07/18 0519  BP: 132/72 122/64  Pulse: 88 74  Resp: 16 17  Temp: 37.6 C 36.8 C  SpO2: 95% 96%    Last Pain:  Vitals:   01/07/18 1000  TempSrc:   PainSc: 9    Pain Goal:                 Shelton SilvasKevin D Canisha Issac

## 2018-01-10 LAB — CULTURE, BLOOD (ROUTINE X 2)
CULTURE: NO GROWTH
Culture: NO GROWTH
Special Requests: ADEQUATE
Special Requests: ADEQUATE

## 2018-01-31 ENCOUNTER — Encounter: Payer: Self-pay | Admitting: Internal Medicine

## 2018-01-31 ENCOUNTER — Ambulatory Visit: Payer: Self-pay | Admitting: Internal Medicine

## 2018-01-31 VITALS — BP 150/98 | HR 80 | Resp 12 | Ht <= 58 in | Wt 127.0 lb

## 2018-01-31 DIAGNOSIS — E782 Mixed hyperlipidemia: Secondary | ICD-10-CM

## 2018-01-31 DIAGNOSIS — E119 Type 2 diabetes mellitus without complications: Secondary | ICD-10-CM

## 2018-01-31 DIAGNOSIS — I10 Essential (primary) hypertension: Secondary | ICD-10-CM

## 2018-01-31 LAB — GLUCOSE, POCT (MANUAL RESULT ENTRY): POC GLUCOSE: 86 mg/dL (ref 70–99)

## 2018-01-31 MED ORDER — METFORMIN HCL ER 500 MG PO TB24: ORAL_TABLET | ORAL | 11 refills | Status: DC

## 2018-01-31 MED ORDER — LISINOPRIL 5 MG PO TABS: 5.0000 mg | ORAL_TABLET | Freq: Every day | ORAL | 11 refills | Status: DC

## 2018-01-31 NOTE — Progress Notes (Signed)
Subjective:    Patient ID: Stephanie Cooke, female    DOB: April 10, 1961, 57 y.o.   MRN: [id number]  HPI   Here to establish. Son, Shirline Frees interprets. Originally from [place]  1.  Cholecystitis:  Lap chole on 01/05/2018.  2.  DM:  Diagnosed in hospital for cholecystitis with DM.  A1C was elevated at 11.7%.   She was taking Glimiperide 4 mg once daily, but stopped 4 days ago as she wanted to discuss with primary care first.   Sugars in the morning before eating were running 90-120.  In the afternoon after eating, 190.  Noted to have elevated cholesterol as well in hospital.    8:30 a.m.    Eats some sort of starchy "cake" in the morning with egg on it.  Cup of tea with whole milk.    2 p.m.  Bread and chicken, potatoes lamb, vegetable, rice.  Water, coffee with powdered milk.    9-11 p.m.:  Chicken, soup, egg or leftovers.  Beans.  Juice.    Current Meds  Medication Sig  . glimepiride (AMARYL) 4 MG tablet TK 1 T PO QD  . [DISCONTINUED] glimepiride (AMARYL) 2 MG tablet Take 1 tablet (2 mg total) by mouth every morning.    No Known Allergies   Past Medical History:  Diagnosis Date  . Diabetes mellitus without complication (HCC) 01/04/2018   Past Surgical History:  Procedure Laterality Date  . CHOLECYSTECTOMY N/A 01/05/2018   Procedure: LAPAROSCOPIC CHOLECYSTECTOMY;  Surgeon: Emelia Loron, MD;  Location: [facility] OR;  Service: General;  Laterality: N/A;    Family History  Problem Relation Age of Onset  . Hypertension Maternal Aunt     Social History   Socioeconomic History  . Marital status: Married    Spouse name: Not on file  . Number of children: Not on file  . Years of education: Not on file  . Highest education level: Not on file  Occupational History  . Not on file  Social Needs  . Financial resource strain: Not on file  . Food insecurity:    Worry: Not on file    Inability: Not on file  . Transportation needs:    Medical: Not on file    Non-medical:  Not on file  Tobacco Use  . Smoking status: Former Games developer  . Smokeless tobacco: Never Used  Substance and Sexual Activity  . Alcohol use: Not Currently  . Drug use: Not Currently  . Sexual activity: Not on file  Lifestyle  . Physical activity:    Days per week: Not on file    Minutes per session: Not on file  . Stress: Not on file  Relationships  . Social connections:    Talks on phone: Not on file    Gets together: Not on file    Attends religious service: Not on file    Active member of club or organization: Not on file    Attends meetings of clubs or organizations: Not on file    Relationship status: Not on file  . Intimate partner violence:    Fear of current or ex partner: Not on file    Emotionally abused: Not on file    Physically abused: Not on file    Forced sexual activity: Not on file  Other Topics Concern  . Not on file  Social History Narrative  . Not on file    Review of Systems     Objective:   Physical Exam NAD HEENT:  PERRL, EOMI, TMs pearly gray, throat without injection. Neck:  Supple, No adenopathy, no thyromegaly Chest:  CTA CV:  RRR with normal S1 and S2, No S3, S4, or murmur.  Radial and DP pulses normal and equal Abd:  S, NT, No HSM  Or mass. + BS.  Lap chole surgical wounds well healed. LE:  No Edema        Assessment & Plan:  1.  DM:  Stop Glimepiride.  Metformin XR 500 mg twice daily with meals. Lifestyle changes discussed at length  2.  Essential Hypertension:  Lisinopril 5 mg daily.  BP and pulse check with BMP in 1 week.  3.  Hyperlipidemia:  Lifestyle changes as above.  Follow up with me in 1 month. Needs orange card for glucometer and test strips.

## 2018-01-31 NOTE — Patient Instructions (Addendum)
Drink a glass of water before every meal Drink 6-8 glasses of water daily Eat three meals daily Eat a protein and healthy fat with every meal (eggs,fish, chicken, [place], nuts and limit red meats) Eat 5 servings of vegetables daily, mix the colors Eat 2 servings of fruit daily with skin, if skin is edible Use smaller plates Put food/utensils down as you chew and swallow each bite Eat at a table with friends/family at least once daily, no TV Do not eat in front of the TV  Recent studies show that people who consume all of their calories in a 12 hour period lose weight more efficiently.  For example, if you eat your first meal at 7:00 a.m., your last meal of the day should be completed by 7:00 p.m.  If you eat peanut butter--get Sm[employer]'s Natural Peanut butter as it has no sugar.

## 2018-02-07 ENCOUNTER — Other Ambulatory Visit: Payer: Self-pay

## 2018-02-08 ENCOUNTER — Other Ambulatory Visit (INDEPENDENT_AMBULATORY_CARE_PROVIDER_SITE_OTHER): Payer: Self-pay | Admitting: Internal Medicine

## 2018-02-08 VITALS — BP 138/80 | HR 82

## 2018-02-08 DIAGNOSIS — I10 Essential (primary) hypertension: Secondary | ICD-10-CM

## 2018-02-08 DIAGNOSIS — Z79899 Other long term (current) drug therapy: Secondary | ICD-10-CM

## 2018-02-08 NOTE — Progress Notes (Signed)
Discussed blood pressure with Dr. Delrae Alfred . Per Dr. Delrae Alfred patient to keep follow up appointment on 03/03/18

## 2018-02-08 NOTE — Progress Notes (Signed)
Tolerating medication.  Maybe a bit light headed at times BP unchanged thus far. Too keep upcoming appt

## 2018-02-09 LAB — BASIC METABOLIC PANEL
BUN / CREAT RATIO: 17 (ref 9–23)
BUN: 11 mg/dL (ref 6–24)
CHLORIDE: 107 mmol/L — AB (ref 96–106)
CO2: 21 mmol/L (ref 20–29)
CREATININE: 0.63 mg/dL (ref 0.57–1.00)
Calcium: 10 mg/dL (ref 8.7–10.2)
GFR calc Af Amer: 114 mL/min/{1.73_m2} (ref >59)
GFR calc non Af Amer: 98 mL/min/{1.73_m2} (ref >59)
GLUCOSE: 130 mg/dL — AB (ref 65–99)
POTASSIUM: 5 mmol/L (ref 3.5–5.2)
SODIUM: 145 mmol/L — AB (ref 134–144)

## 2018-03-03 ENCOUNTER — Ambulatory Visit: Payer: Self-pay | Admitting: Internal Medicine

## 2018-10-30 ENCOUNTER — Encounter (HOSPITAL_COMMUNITY)
Admission: EM | Disposition: A | Discharge status: Home / Self Care | Payer: Self-pay | Source: Home / Self Care | Attending: Cardiology

## 2018-10-30 ENCOUNTER — Encounter (HOSPITAL_COMMUNITY): Payer: Self-pay | Admitting: Emergency Medicine

## 2018-10-30 ENCOUNTER — Inpatient Hospital Stay (HOSPITAL_COMMUNITY)
Admission: EM | Admit: 2018-10-30 | Discharge: 2018-11-03 | DRG: 247 | Disposition: A | Discharge status: Home / Self Care | Payer: Self-pay | Attending: Cardiology | Admitting: Cardiology

## 2018-10-30 ENCOUNTER — Emergency Department (HOSPITAL_COMMUNITY): Payer: Self-pay

## 2018-10-30 DIAGNOSIS — I2119 ST elevation (STEMI) myocardial infarction involving other coronary artery of inferior wall: Principal | ICD-10-CM

## 2018-10-30 DIAGNOSIS — I2111 ST elevation (STEMI) myocardial infarction involving right coronary artery: Secondary | ICD-10-CM

## 2018-10-30 DIAGNOSIS — E119 Type 2 diabetes mellitus without complications: Secondary | ICD-10-CM | POA: Diagnosis present

## 2018-10-30 DIAGNOSIS — I2121 ST elevation (STEMI) myocardial infarction involving left circumflex coronary artery: Secondary | ICD-10-CM

## 2018-10-30 DIAGNOSIS — I4510 Unspecified right bundle-branch block: Secondary | ICD-10-CM | POA: Diagnosis present

## 2018-10-30 DIAGNOSIS — I10 Essential (primary) hypertension: Secondary | ICD-10-CM

## 2018-10-30 DIAGNOSIS — Z79899 Other long term (current) drug therapy: Secondary | ICD-10-CM

## 2018-10-30 DIAGNOSIS — I472 Ventricular tachycardia: Secondary | ICD-10-CM | POA: Diagnosis present

## 2018-10-30 DIAGNOSIS — Z87891 Personal history of nicotine dependence: Secondary | ICD-10-CM

## 2018-10-30 DIAGNOSIS — R51 Headache: Secondary | ICD-10-CM | POA: Diagnosis not present

## 2018-10-30 DIAGNOSIS — I2510 Atherosclerotic heart disease of native coronary artery without angina pectoris: Secondary | ICD-10-CM | POA: Diagnosis present

## 2018-10-30 DIAGNOSIS — Z8249 Family history of ischemic heart disease and other diseases of the circulatory system: Secondary | ICD-10-CM

## 2018-10-30 DIAGNOSIS — Z955 Presence of coronary angioplasty implant and graft: Secondary | ICD-10-CM

## 2018-10-30 DIAGNOSIS — I213 ST elevation (STEMI) myocardial infarction of unspecified site: Secondary | ICD-10-CM | POA: Diagnosis present

## 2018-10-30 DIAGNOSIS — E782 Mixed hyperlipidemia: Secondary | ICD-10-CM | POA: Diagnosis present

## 2018-10-30 DIAGNOSIS — E876 Hypokalemia: Secondary | ICD-10-CM | POA: Diagnosis present

## 2018-10-30 DIAGNOSIS — Z9049 Acquired absence of other specified parts of digestive tract: Secondary | ICD-10-CM

## 2018-10-30 DIAGNOSIS — Z7984 Long term (current) use of oral hypoglycemic drugs: Secondary | ICD-10-CM

## 2018-10-30 DIAGNOSIS — E785 Hyperlipidemia, unspecified: Secondary | ICD-10-CM

## 2018-10-30 HISTORY — DX: Essential (primary) hypertension: I10

## 2018-10-30 HISTORY — PX: CORONARY/GRAFT ACUTE MI REVASCULARIZATION: CATH118305

## 2018-10-30 HISTORY — DX: ST elevation (STEMI) myocardial infarction involving other coronary artery of inferior wall: I21.19

## 2018-10-30 HISTORY — PX: LEFT HEART CATH AND CORONARY ANGIOGRAPHY: CATH118249

## 2018-10-30 HISTORY — DX: Hyperlipidemia, unspecified: E78.5

## 2018-10-30 HISTORY — PX: CORONARY STENT INTERVENTION: CATH118234

## 2018-10-30 LAB — CBC WITH DIFFERENTIAL/PLATELET
Abs Immature Granulocytes: 0.07 10*3/uL (ref 0.00–0.07)
Basophils Absolute: 0.1 10*3/uL (ref 0.0–0.1)
Basophils Relative: 1 %
EOS ABS: 0.1 10*3/uL (ref 0.0–0.5)
EOS PCT: 1 %
HCT: 47.9 % — ABNORMAL HIGH (ref 36.0–46.0)
Hemoglobin: 15.7 g/dL — ABNORMAL HIGH (ref 12.0–15.0)
Immature Granulocytes: 1 %
Lymphocytes Relative: 41 %
Lymphs Abs: 5.2 10*3/uL — ABNORMAL HIGH (ref 0.7–4.0)
MCH: 27.8 pg (ref 26.0–34.0)
MCHC: 32.8 g/dL (ref 30.0–36.0)
MCV: 84.9 fL (ref 80.0–100.0)
Monocytes Absolute: 0.7 10*3/uL (ref 0.1–1.0)
Monocytes Relative: 5 %
Neutro Abs: 6.7 10*3/uL (ref 1.7–7.7)
Neutrophils Relative %: 51 %
Platelets: 271 10*3/uL (ref 150–400)
RBC: 5.64 MIL/uL — ABNORMAL HIGH (ref 3.87–5.11)
RDW: 12.5 % (ref 11.5–15.5)
WBC: 12.8 10*3/uL — AB (ref 4.0–10.5)
nRBC: 0 % (ref 0.0–0.2)

## 2018-10-30 LAB — COMPREHENSIVE METABOLIC PANEL
ALK PHOS: 103 U/L (ref 38–126)
ALT: 27 U/L (ref 0–44)
AST: 31 U/L (ref 15–41)
Albumin: 3.9 g/dL (ref 3.5–5.0)
Anion gap: 16 — ABNORMAL HIGH (ref 5–15)
BILIRUBIN TOTAL: 0.7 mg/dL (ref 0.3–1.2)
BUN: 12 mg/dL (ref 6–20)
CO2: 21 mmol/L — ABNORMAL LOW (ref 22–32)
Calcium: 9.6 mg/dL (ref 8.9–10.3)
Chloride: 102 mmol/L (ref 98–111)
Creatinine, Ser: 0.62 mg/dL (ref 0.44–1.00)
GFR calc Af Amer: >60 mL/min (ref >60)
GFR calc non Af Amer: >60 mL/min (ref >60)
Glucose, Bld: 234 mg/dL — ABNORMAL HIGH (ref 70–99)
Potassium: 3.2 mmol/L — ABNORMAL LOW (ref 3.5–5.1)
Sodium: 139 mmol/L (ref 135–145)
Total Protein: 7.7 g/dL (ref 6.5–8.1)

## 2018-10-30 LAB — POCT I-STAT CREATININE: Creatinine, Ser: 0.4 mg/dL — ABNORMAL LOW (ref 0.44–1.00)

## 2018-10-30 LAB — LIPID PANEL
Cholesterol: 227 mg/dL — ABNORMAL HIGH (ref 0–200)
HDL: 50 mg/dL (ref >40)
LDL Cholesterol: 146 mg/dL — ABNORMAL HIGH (ref 0–99)
Total CHOL/HDL Ratio: 4.5 RATIO
Triglycerides: 153 mg/dL — ABNORMAL HIGH (ref <150)
VLDL: 31 mg/dL (ref 0–40)

## 2018-10-30 LAB — POCT I-STAT 7, (LYTES, BLD GAS, ICA,H+H)
Acid-base deficit: 1 mmol/L (ref 0.0–2.0)
Bicarbonate: 22.4 mmol/L (ref 20.0–28.0)
Calcium, Ion: 1.18 mmol/L (ref 1.15–1.40)
HCT: 41 % (ref 36.0–46.0)
Hemoglobin: 13.9 g/dL (ref 12.0–15.0)
O2 Saturation: 95 %
Potassium: 3.1 mmol/L — ABNORMAL LOW (ref 3.5–5.1)
Sodium: 140 mmol/L (ref 135–145)
TCO2: 23 mmol/L (ref 22–32)
pCO2 arterial: 32.7 mmHg (ref 32.0–48.0)
pH, Arterial: 7.443 (ref 7.350–7.450)
pO2, Arterial: 71 mmHg — ABNORMAL LOW (ref 83.0–108.0)

## 2018-10-30 LAB — APTT: aPTT: 27 seconds (ref 24–36)

## 2018-10-30 LAB — MRSA PCR SCREENING: MRSA by PCR: NEGATIVE

## 2018-10-30 LAB — HEMOGLOBIN A1C
Hgb A1c MFr Bld: 11 % — ABNORMAL HIGH (ref 4.8–5.6)
Mean Plasma Glucose: 269 mg/dL

## 2018-10-30 LAB — PROTIME-INR
INR: 1.04
Prothrombin Time: 13.5 seconds (ref 11.4–15.2)

## 2018-10-30 LAB — TROPONIN I
Troponin I: <0.03 ng/mL (ref <0.03)
Troponin I: 0.83 ng/mL (ref <0.03)

## 2018-10-30 LAB — POCT ACTIVATED CLOTTING TIME: Activated Clotting Time: 318 seconds

## 2018-10-30 LAB — GLUCOSE, CAPILLARY: Glucose-Capillary: 296 mg/dL — ABNORMAL HIGH (ref 70–99)

## 2018-10-30 LAB — I-STAT BETA HCG BLOOD, ED (MC, WL, AP ONLY): I-stat hCG, quantitative: 11.2 m[IU]/mL — ABNORMAL HIGH (ref <5)

## 2018-10-30 SURGERY — LEFT HEART CATH AND CORONARY ANGIOGRAPHY
Anesthesia: LOCAL

## 2018-10-30 MED ORDER — SODIUM CHLORIDE 0.9 % IV SOLN: 250.0000 mL | INTRAVENOUS | Status: DC | PRN

## 2018-10-30 MED ORDER — HEPARIN SODIUM (PORCINE) 5000 UNIT/ML IJ SOLN
60.0000 [IU]/kg | Freq: Once | INTRAMUSCULAR | Status: AC
  Administered 2018-10-30: 3550 [IU] via INTRAVENOUS

## 2018-10-30 MED ORDER — SODIUM CHLORIDE 0.9% FLUSH: 3.0000 mL | INTRAVENOUS | Status: DC | PRN

## 2018-10-30 MED ORDER — NITROGLYCERIN 1 MG/10 ML FOR IR/CATH LAB
INTRA_ARTERIAL | Status: DC | PRN
  Administered 2018-10-30: 200 ug via INTRACORONARY

## 2018-10-30 MED ORDER — SODIUM CHLORIDE 0.9 % WEIGHT BASED INFUSION: 1.0000 mL/kg/h | INTRAVENOUS | Status: AC

## 2018-10-30 MED ORDER — ENOXAPARIN SODIUM 40 MG/0.4ML ~~LOC~~ SOLN: 40.0000 mg | SUBCUTANEOUS | Status: DC

## 2018-10-30 MED ORDER — VERAPAMIL HCL 2.5 MG/ML IV SOLN
INTRAVENOUS | Status: DC | PRN
  Administered 2018-10-30: 10 mL via INTRA_ARTERIAL

## 2018-10-30 MED ORDER — ACETAMINOPHEN 325 MG PO TABS: 650.0000 mg | ORAL_TABLET | ORAL | Status: DC | PRN

## 2018-10-30 MED ORDER — ASPIRIN 81 MG PO CHEW
CHEWABLE_TABLET | ORAL | Status: AC
  Filled 2018-10-30: qty 4

## 2018-10-30 MED ORDER — NITROGLYCERIN IN D5W 200-5 MCG/ML-% IV SOLN
INTRAVENOUS | Status: AC
  Filled 2018-10-30: qty 250

## 2018-10-30 MED ORDER — ONDANSETRON HCL 4 MG/2ML IJ SOLN: 4.0000 mg | Freq: Four times a day (QID) | INTRAMUSCULAR | Status: DC | PRN

## 2018-10-30 MED ORDER — ENOXAPARIN SODIUM 40 MG/0.4ML ~~LOC~~ SOLN
40.0000 mg | SUBCUTANEOUS | Status: DC
  Administered 2018-10-31 – 2018-11-02 (×3): 40 mg via SUBCUTANEOUS
  Filled 2018-10-30 (×2): qty 0.4

## 2018-10-30 MED ORDER — INSULIN ASPART 100 UNIT/ML ~~LOC~~ SOLN
0.0000 [IU] | Freq: Every day | SUBCUTANEOUS | Status: DC
  Administered 2018-10-30: 3 [IU] via SUBCUTANEOUS

## 2018-10-30 MED ORDER — TICAGRELOR 90 MG PO TABS
ORAL_TABLET | ORAL | Status: AC
  Filled 2018-10-30: qty 2

## 2018-10-30 MED ORDER — VERAPAMIL HCL 2.5 MG/ML IV SOLN
INTRAVENOUS | Status: AC
  Filled 2018-10-30: qty 2

## 2018-10-30 MED ORDER — TICAGRELOR 90 MG PO TABS
90.0000 mg | ORAL_TABLET | Freq: Two times a day (BID) | ORAL | Status: DC
  Administered 2018-10-30 – 2018-11-03 (×8): 90 mg via ORAL
  Filled 2018-10-30 (×8): qty 1

## 2018-10-30 MED ORDER — NITROGLYCERIN 0.4 MG SL SUBL: 0.4000 mg | SUBLINGUAL_TABLET | SUBLINGUAL | Status: DC | PRN

## 2018-10-30 MED ORDER — SODIUM CHLORIDE 0.9 % IV SOLN
INTRAVENOUS | Status: DC
  Administered 2018-10-30: 17:00:00 via INTRAVENOUS

## 2018-10-30 MED ORDER — HEPARIN (PORCINE) IN NACL 1000-0.9 UT/500ML-% IV SOLN
INTRAVENOUS | Status: AC
  Filled 2018-10-30: qty 500

## 2018-10-30 MED ORDER — ONDANSETRON HCL 4 MG/2ML IJ SOLN
4.0000 mg | Freq: Once | INTRAMUSCULAR | Status: AC
  Administered 2018-10-30: 4 mg via INTRAVENOUS

## 2018-10-30 MED ORDER — SODIUM CHLORIDE 0.9% FLUSH
3.0000 mL | Freq: Two times a day (BID) | INTRAVENOUS | Status: DC
  Administered 2018-10-31 – 2018-11-03 (×6): 3 mL via INTRAVENOUS

## 2018-10-30 MED ORDER — ONDANSETRON HCL 4 MG/2ML IJ SOLN
INTRAMUSCULAR | Status: AC
  Administered 2018-10-30: 4 mg
  Filled 2018-10-30: qty 2

## 2018-10-30 MED ORDER — NITROGLYCERIN 0.4 MG SL SUBL
0.4000 mg | SUBLINGUAL_TABLET | SUBLINGUAL | Status: AC | PRN
  Administered 2018-10-30: 0.4 mg via SUBLINGUAL

## 2018-10-30 MED ORDER — HEPARIN SODIUM (PORCINE) 1000 UNIT/ML IJ SOLN
INTRAMUSCULAR | Status: AC
  Filled 2018-10-30: qty 1

## 2018-10-30 MED ORDER — INSULIN ASPART 100 UNIT/ML ~~LOC~~ SOLN
0.0000 [IU] | Freq: Three times a day (TID) | SUBCUTANEOUS | Status: DC
  Administered 2018-10-31 (×2): 5 [IU] via SUBCUTANEOUS
  Administered 2018-11-01 (×2): 3 [IU] via SUBCUTANEOUS
  Administered 2018-11-01 – 2018-11-02 (×2): 5 [IU] via SUBCUTANEOUS
  Administered 2018-11-02: 8 [IU] via SUBCUTANEOUS
  Administered 2018-11-02: 5 [IU] via SUBCUTANEOUS
  Administered 2018-11-03: 3 [IU] via SUBCUTANEOUS
  Administered 2018-11-03: 5 [IU] via SUBCUTANEOUS

## 2018-10-30 MED ORDER — ATORVASTATIN CALCIUM 80 MG PO TABS: 80.0000 mg | ORAL_TABLET | Freq: Every day | ORAL | Status: DC

## 2018-10-30 MED ORDER — NITROGLYCERIN IN D5W 200-5 MCG/ML-% IV SOLN
0.0000 ug/min | INTRAVENOUS | Status: DC
  Administered 2018-10-30: 5 ug/min via INTRAVENOUS

## 2018-10-30 MED ORDER — MORPHINE SULFATE (PF) 2 MG/ML IV SOLN
INTRAVENOUS | Status: AC
  Administered 2018-10-30: 2 mg via INTRAVENOUS
  Filled 2018-10-30: qty 1

## 2018-10-30 MED ORDER — HEPARIN (PORCINE) IN NACL 1000-0.9 UT/500ML-% IV SOLN
INTRAVENOUS | Status: DC | PRN
  Administered 2018-10-30 (×2): 500 mL

## 2018-10-30 MED ORDER — SODIUM CHLORIDE 0.9% FLUSH
3.0000 mL | Freq: Two times a day (BID) | INTRAVENOUS | Status: DC
  Administered 2018-10-31 – 2018-11-03 (×5): 3 mL via INTRAVENOUS

## 2018-10-30 MED ORDER — ONDANSETRON HCL 4 MG/2ML IJ SOLN
INTRAMUSCULAR | Status: AC
  Filled 2018-10-30: qty 2

## 2018-10-30 MED ORDER — TICAGRELOR 90 MG PO TABS
ORAL_TABLET | ORAL | Status: DC | PRN
  Administered 2018-10-30: 180 mg via ORAL

## 2018-10-30 MED ORDER — ACETAMINOPHEN 325 MG PO TABS
650.0000 mg | ORAL_TABLET | ORAL | Status: DC | PRN
  Administered 2018-10-31: 650 mg via ORAL
  Filled 2018-10-30: qty 2

## 2018-10-30 MED ORDER — ASPIRIN 81 MG PO CHEW
324.0000 mg | CHEWABLE_TABLET | Freq: Once | ORAL | Status: AC
  Administered 2018-10-30: 324 mg via ORAL

## 2018-10-30 MED ORDER — ASPIRIN 81 MG PO CHEW: 81.0000 mg | CHEWABLE_TABLET | Freq: Every day | ORAL | Status: DC

## 2018-10-30 MED ORDER — NITROGLYCERIN 1 MG/10 ML FOR IR/CATH LAB
INTRA_ARTERIAL | Status: AC
  Filled 2018-10-30: qty 10

## 2018-10-30 MED ORDER — ALPRAZOLAM 0.25 MG PO TABS: 0.2500 mg | ORAL_TABLET | Freq: Two times a day (BID) | ORAL | Status: DC | PRN

## 2018-10-30 MED ORDER — HEPARIN SODIUM (PORCINE) 5000 UNIT/ML IJ SOLN
INTRAMUSCULAR | Status: AC
  Filled 2018-10-30: qty 1

## 2018-10-30 MED ORDER — HEPARIN SODIUM (PORCINE) 1000 UNIT/ML IJ SOLN
INTRAMUSCULAR | Status: DC | PRN
  Administered 2018-10-30: 3000 [IU] via INTRAVENOUS

## 2018-10-30 MED ORDER — NITROGLYCERIN 0.4 MG SL SUBL
SUBLINGUAL_TABLET | SUBLINGUAL | Status: AC
  Administered 2018-10-30: 0.4 mg
  Filled 2018-10-30: qty 1

## 2018-10-30 MED ORDER — ATORVASTATIN CALCIUM 80 MG PO TABS
80.0000 mg | ORAL_TABLET | Freq: Every day | ORAL | Status: DC
  Administered 2018-10-31 – 2018-11-02 (×3): 80 mg via ORAL
  Filled 2018-10-30 (×3): qty 1

## 2018-10-30 MED ORDER — ASPIRIN EC 81 MG PO TBEC
81.0000 mg | DELAYED_RELEASE_TABLET | Freq: Every day | ORAL | Status: DC
  Administered 2018-10-31 – 2018-11-03 (×4): 81 mg via ORAL
  Filled 2018-10-30 (×4): qty 1

## 2018-10-30 MED ORDER — LIDOCAINE HCL (PF) 1 % IJ SOLN
INTRAMUSCULAR | Status: DC | PRN
  Administered 2018-10-30: 2 mL via INTRADERMAL

## 2018-10-30 MED ORDER — ZOLPIDEM TARTRATE 5 MG PO TABS: 5.0000 mg | ORAL_TABLET | Freq: Every evening | ORAL | Status: DC | PRN

## 2018-10-30 SURGICAL SUPPLY — 17 items
BALLN SAPPHIRE 2.0X12 (BALLOONS) ×2
CATH 5FR JL3.5 JR4 ANG PIG MP (CATHETERS) ×2
CATH INFINITI JR3.5 6F 100CM (CATHETERS) ×2
CATH LAUNCHER 6FR EBU3.5 (CATHETERS) ×2
CATH VISTA GUIDE 6FR JR4 (CATHETERS) ×2
DEVICE RAD COMP TR BAND LRG (VASCULAR PRODUCTS) ×2
ELECT DEFIB PAD ADLT CADENCE (PAD) ×2
GLIDESHEATH SLEND SS 6F .021 (SHEATH) ×2
INQWIRE 1.5J .035X260CM (WIRE) ×2
KIT ENCORE 26 ADVANTAGE (KITS) ×2
KIT HEART LEFT (KITS) ×2
PACK CARDIAC CATHETERIZATION (CUSTOM PROCEDURE TRAY) ×2
STENT SYNERGY DES 2.25X16 (Permanent Stent) ×4 IMPLANT
SYR MEDRAD MARK 7 150ML (SYRINGE) ×2
TRANSDUCER W/STOPCOCK (MISCELLANEOUS) ×2
TUBING CIL FLEX 10 FLL-RA (TUBING) ×2
WIRE ASAHI PROWATER 180CM (WIRE) ×2

## 2018-10-30 NOTE — ED Notes (Signed)
Pt transported to cath lab with Marcie Bal, RN

## 2018-10-30 NOTE — ED Triage Notes (Signed)
Per Pt's family member pt began having generalized chest pain with radiation to the back earlier today. EKG captured in triage and given to Dr Adela Lank, code stemi activated in triage. Pt wheeled back to Trauma [facility]. Pt speaks arabic and pt's family member interpreting.

## 2018-10-30 NOTE — ED Notes (Signed)
Activated code stemi  

## 2018-10-30 NOTE — H&P (Addendum)
Cardiology Admission History and Physical:   Patient ID: Stephanie Cooke; MRN: [id number]; DOB: 04-23-1961   Admission date: 10/30/2018  Primary Care Provider: Julieanne Manson, MD Primary Cardiologist: Sammi Stolarz [place], MD, New Primary Electrophysiologist:  None  Chief Complaint: Inferior STEMI  Patient Profile:   Stephanie Cooke is a 58 y.o. [place] female with a history of hypertension, diabetes, hyperlipidemia.  Stephanie Cooke had chest pain today and came to the hospital.  Stephanie Cooke is accompanied today by Stephanie Cooke son.  Stephanie Cooke speaks [place] Arabic, and he is translating for Stephanie Cooke.  History of Present Illness:   Stephanie Cooke has no history of any cardiac issues.  Stephanie Cooke has never had a cardiac evaluation.  Prior to today, Stephanie Cooke has never had chest pain.  Stephanie Cooke had chest pain at approximately 8 AM today.  Stephanie Cooke had another episode about noon, and another episode at approximately 3:30 PM.  The last episode was the most severe.  It was associated with shortness of breath, nausea and vomiting.  Stephanie Cooke son brought Stephanie Cooke to the hospital.  In the emergency room, Stephanie Cooke ECG was consistent with an inferior STEMI.  Stephanie Cooke was given aspirin and a heparin bolus.  IV nitroglycerin drip was started.   Dr. [place] evaluated Stephanie Cooke.  Stephanie Cooke was still having severe pain and Stephanie Cooke ECG was still significantly abnormal.  Stephanie Cooke was taken emergently to the Cath Lab.  When the patient was told this by Stephanie Cooke son, Stephanie Cooke stated it was okay to do what ever we needed to do to get Stephanie Cooke better.  Stephanie Cooke denies allergies to any medications and denies any bleeding issues.   Past Medical History:  Diagnosis Date  . Diabetes mellitus without complication (HCC) 01/04/2018  . HTN (hypertension) 10/30/2018  . Hyperlipidemia LDL goal <70 10/30/2018  . STEMI involving oth coronary artery of inferior wall (HCC) 10/30/2018    Past Surgical History:  Procedure Laterality Date  . CHOLECYSTECTOMY N/A 01/05/2018   Procedure: LAPAROSCOPIC CHOLECYSTECTOMY;  Surgeon: Emelia Loron, MD;   Location: [facility] OR;  Service: General;  Laterality: N/A;     Medications Prior to Admission: Prior to Admission medications   Medication Sig Start Date End Date Taking? Authorizing Provider  glimepiride (AMARYL) 4 MG tablet TK 1 T PO QD 01/07/18   [provider]  lisinopril (PRINIVIL,ZESTRIL) 5 MG tablet Take 1 tablet (5 mg total) by mouth daily. 01/31/18   Julieanne Manson, MD  metFORMIN (GLUCOPHAGE-XR) 500 MG 24 hr tablet 1 tab by mouth twice daily with meals 01/31/18   Julieanne Manson, MD     Allergies:   No Known Allergies  Social History: No other history is available due to patient condition. Social History   Socioeconomic History  . Marital status: Married    Spouse name: Not on file  . Number of children: Not on file  . Years of education: Not on file  . Highest education level: Not on file  Occupational History  . Not on file  Social Needs  . Financial resource strain: Not on file  . Food insecurity:    Worry: Not on file    Inability: Not on file  . Transportation needs:    Medical: Not on file    Non-medical: Not on file  Tobacco Use  . Smoking status: Former Games developer  . Smokeless tobacco: Never Used  Substance and Sexual Activity  . Alcohol use: Not Currently  . Drug use: Not Currently  . Sexual activity: Not on file  Lifestyle  . Physical activity:  Days per week: Not on file    Minutes per session: Not on file  . Stress: Not on file  Relationships  . Social connections:    Talks on phone: Not on file    Gets together: Not on file    Attends religious service: Not on file    Active member of club or organization: Not on file    Attends meetings of clubs or organizations: Not on file    Relationship status: Not on file  . Intimate partner violence:    Fear of current or ex partner: Not on file    Emotionally abused: Not on file    Physically abused: Not on file    Forced sexual activity: Not on file  Other Topics Concern  . Not on file    Social History Narrative  . Not on file    Family History: No other history available due to patient condition. The patient's family history includes Hypertension in Stephanie Cooke maternal aunt.   The patient Stephanie Cooke indicated that the status of Stephanie Cooke maternal aunt is unknown.    ROS: No ROS is available due to patient condition. Please see the history of present illness.  All other ROS reviewed and negative.     Physical Exam/Data:   Vitals:   10/30/18 1645 10/30/18 1649 10/30/18 1700 10/30/18 1708  BP: (!) 143/98  (!) 143/91   Pulse: 98  (!) 110   Resp: 13  15   SpO2: 98%  94% 100%  Weight:      Height:  4\' 11"  (1.499 m)     No intake or output data in the 24 hours ending 10/30/18 1731 Filed Weights   10/30/18 1600  Weight: 59 kg   Body mass index is 26.26 kg/m.  General:  Well nourished, well developed, in acute distress HEENT: normal Lymph: no adenopathy Neck:  JVD not elevated Endocrine:  No thryomegaly Vascular: No carotid bruits; FA pulses 2+ bilaterally    Cardiac:  normal S1, S2; RRR; no significant murmur, no rub or gallop  Lungs:  clear to auscultation bilaterally, no wheezing, rhonchi or rales  Abd: soft, nontender, no hepatomegaly  Ext: No edema Musculoskeletal:  No deformities, BUE and BLE strength normal and equal Skin: warm and dry  Neuro:  CNs 2-12 intact, no focal abnormalities noted Psych:  Normal affect    EKG:  The ECG that was done today was personally reviewed and demonstrates sinus rhythm with inferior ST elevation  Relevant CV Studies: None  ECHO:   CATH:   Laboratory Data: Other labs are incomplete  ChemistryNo results for input(s): NA, K, CL, CO2, GLUCOSE, BUN, CREATININE, CALCIUM, GFRNONAA, GFRAA, ANIONGAP in the last 168 hours.  No results for input(s): PROT, ALBUMIN, AST, ALT, ALKPHOS, BILITOT in the last 168 hours. Hematology Recent Labs  Lab 10/30/18 1635  WBC 12.8*  RBC 5.64*  HGB 15.7*  HCT 47.9*  MCV 84.9  MCH 27.8  MCHC 32.8   RDW 12.5  PLT 271   Cardiac EnzymesNo results for input(s): TROPONINI in the last 168 hours. No results for input(s): TROPIPOC in the last 168 hours.  BNPNo results for input(s): BNP, PROBNP in the last 168 hours.  DDimer No results for input(s): DDIMER in the last 168 hours.  Radiology/Studies:  Dg Chest Port 1 View  Result Date: 10/30/2018 CLINICAL DATA:  Chest pain. EXAM: PORTABLE CHEST 1 VIEW COMPARISON:  None. FINDINGS: The heart size and pulmonary vascularity are normal. No infiltrates or effusions.  No acute bone abnormality. IMPRESSION: No acute abnormalities. Electronically Signed   By: Francene BoyersJames  Maxwell M.D.   On: 10/30/2018 16:57    Assessment and Plan:   Principal Problem: 1.  STEMI involving oth coronary artery of inferior wall (HCC) -Stephanie Cooke is being taken directly to the Cath Lab with further evaluation and treatment depending on the results -We were not able to get an onsite translator, and the video translator did not have [place] Arabic available.  Stephanie Cooke does not understand other types of Arabic. - Stephanie Cooke son speaks very good English and is willing to translate for Stephanie Cooke. -Stephanie Cooke will be started on high-dose statin and aspirin, other medications per Dr. [place]  Active Problems: 2.  New onset type 2 diabetes mellitus (HCC) - Hold metformin and Amaryl -Add sliding scale insulin and check a hemoglobin A1c  3.  HTN (hypertension) -Prior to admission, Stephanie Cooke was on lisinopril 5 mg daily - Once Stephanie Cooke renal function is evaluated, decide on restarting that today or choosing another blood pressure medication -Dr. [place]az[place]Jordan to add beta-blocker if blood pressure and heart rate will tolerate  4.  Hyperlipidemia LDL goal <70 -Add high-dose statin and check a profile along with liver functions   Severity of Illness: The appropriate patient status for this patient is INPATIENT. Inpatient status is judged to be reasonable and necessary in order to provide the required intensity of service to ensure  the patient's safety. The patient's presenting symptoms, physical exam findings, and initial radiographic and laboratory data in the context of their chronic comorbidities is felt to place them at high risk for further clinical deterioration. Furthermore, it is not anticipated that the patient will be medically stable for discharge from the hospital within 2 midnights of admission. The following factors support the patient status of inpatient.   " The patient's presenting symptoms include chest pain. " The worrisome physical exam findings include abnormal ECG. " The initial radiographic and laboratory data are worrisome because of abnormal ECG. " The chronic co-morbidities include hypertension.   * I certify that at the point of admission it is my clinical judgment that the patient will require inpatient hospital care spanning beyond 2 midnights from the point of admission due to high intensity of service, high risk for further deterioration and high frequency of surveillance required.*    For questions or updates, please contact [facility] Please consult [email or web address] for contact info under Cardiology/STEMI.    Signed, Theodore DemarkRhonda Barrett, PA-C  10/30/2018 5:31 PM  Patient seen and examined and history reviewed. Agree with above findings and plan. 58 yo [place] female presents with acute inferolateral STEMI. Translation provided by Stephanie Cooke son who speaks [place] well. Stephanie Cooke has a history of poorly controlled DM, HLD, and HTN. Will proceed with emergent cardiac cath and PCI as needed. Will need good risk factor management post procedure.   Ivyana Locey [place], MDFACC 10/30/2018 6:26 PM

## 2018-10-30 NOTE — ED Provider Notes (Signed)
[facility] EMERGENCY [facility]MENT Provider Note   CSN: [id number] Arrival date & time: 10/30/18  1621     History   Chief Complaint Chief Complaint  Patient presents with  . Code STEMI    HPI Kristian Coveylawia Mayer is a 58 y.o. female.  58 yo F with a chief complaint of chest pain.  This started earlier today.  She has had 3 severe episodes 1 just prior to arrival here.  He said this happened and she had severe pressure on her chest that radiated to the back and made her sweaty and short of breath.  Not exertional.  Never had a heart attack before.  Has a history of hypertension and diabetes.  Former smoker quit a couple years ago.  The history is provided by the patient. The history is limited by a language barrier. A language interpreter was used (son).  Illness  Severity:  Mild Onset quality:  Sudden Duration:  8 hours Timing:  Constant Progression:  Waxing and waning Chronicity:  New Associated symptoms: chest pain and shortness of breath   Associated symptoms: no congestion, no fever, no headaches, no myalgias, no nausea, no rhinorrhea, no vomiting and no wheezing     Past Medical History:  Diagnosis Date  . Diabetes mellitus without complication (HCC) 01/04/2018    Patient Active Problem List   Diagnosis Date Noted  . Acute cholecystitis 01/05/2018  . Sepsis (HCC) 01/05/2018  . New onset type 2 diabetes mellitus (HCC) 01/05/2018  . Hypokalemia 01/05/2018  . Diabetes mellitus without complication (HCC) 01/04/2018    Past Surgical History:  Procedure Laterality Date  . CHOLECYSTECTOMY N/A 01/05/2018   Procedure: LAPAROSCOPIC CHOLECYSTECTOMY;  Surgeon: Emelia LoronWakefield, Matthew, MD;  [facility];  Service: General;  Laterality: N/A;     OB History   No obstetric history on file.      Home Medications    Prior to Admission medications   Medication Sig Start Date End Date Taking? Authorizing Provider  glimepiride (AMARYL) 4 MG tablet TK 1 T PO QD  01/07/18   [provider]  lisinopril (PRINIVIL,ZESTRIL) 5 MG tablet Take 1 tablet (5 mg total) by mouth daily. 01/31/18   Julieanne MansonMulberry, Elizabeth, MD  metFORMIN (GLUCOPHAGE-XR) 500 MG 24 hr tablet 1 tab by mouth twice daily with meals 01/31/18   Julieanne MansonMulberry, Elizabeth, MD    Family History Family History  Problem Relation Age of Onset  . Hypertension Maternal Aunt     Social History Social History   Tobacco Use  . Smoking status: Former Games developermoker  . Smokeless tobacco: Never Used  Substance Use Topics  . Alcohol use: Not Currently  . Drug use: Not Currently     Allergies   Patient has no known allergies.   Review of Systems Review of Systems  Constitutional: Positive for diaphoresis. Negative for chills and fever.  HENT: Negative for congestion and rhinorrhea.   Eyes: Negative for redness and visual disturbance.  Respiratory: Positive for shortness of breath. Negative for wheezing.   Cardiovascular: Positive for chest pain. Negative for palpitations.  Gastrointestinal: Negative for nausea and vomiting.  Genitourinary: Negative for dysuria and urgency.  Musculoskeletal: Negative for arthralgias and myalgias.  Skin: Negative for pallor and wound.  Neurological: Negative for dizziness and headaches.     Physical Exam Updated Vital Signs BP (!) 143/91   Pulse (!) 110   Resp 15   Ht 4\' 11"  (1.499 m)   Wt 59 kg   LMP  (LMP Unknown)  SpO2 100%   BMI 26.26 kg/m   Physical Exam Vitals signs and nursing note reviewed.  Constitutional:      General: She is not in acute distress.    Appearance: She is well-developed. She is not diaphoretic.     Comments: Smells of woodsmoke  HENT:     Head: Normocephalic and atraumatic.  Eyes:     Pupils: Pupils are equal, round, and reactive to light.  Neck:     Musculoskeletal: Normal range of motion and neck supple.  Cardiovascular:     Rate and Rhythm: Regular rhythm. Tachycardia present.     Heart sounds: No murmur. No  friction rub. No gallop.   Pulmonary:     Effort: Pulmonary effort is normal.     Breath sounds: No wheezing or rales.  Abdominal:     General: There is no distension.     Palpations: Abdomen is soft.     Tenderness: There is no abdominal tenderness.  Musculoskeletal:        General: No tenderness.  Skin:    General: Skin is warm and dry.  Neurological:     Mental Status: She is alert and oriented to person, place, and time.  Psychiatric:        Behavior: Behavior normal.      ED Treatments / Results  Labs (all labs ordered are listed, but only abnormal results are displayed) Labs Reviewed  I-STAT BETA HCG BLOOD, ED ([facility], [facility], [facility] ONLY) - Abnormal; Notable for the following components:      Result Value   I-stat hCG, quantitative 11.2 (*)    All other components within normal limits  CBC WITH DIFFERENTIAL/PLATELET  PROTIME-INR  APTT  COMPREHENSIVE METABOLIC PANEL  TROPONIN I  LIPID PANEL    EKG EKG Interpretation  Date/Time:  Monday October 30 2018 16:28:42 EST Ventricular Rate:  85 PR Interval:  150 QRS Duration: 86 QT Interval:  370 QTC Calculation: 440 R Axis:   67 Text Interpretation:   Critical Test Result: STEMI Sinus rhythm with frequent Premature ventricular complexes Cannot rule out Anterior infarct , age undetermined Inferolateral injury pattern ** ** ACUTE MI / STEMI ** ** Consider right ventricular involvement in acute inferior infarct Abnormal ECG Confirmed by Melene Plan [phone]) on 10/30/2018 4:35:15 PM   Radiology Dg Chest Port 1 View  Result Date: 10/30/2018 CLINICAL DATA:  Chest pain. EXAM: PORTABLE CHEST 1 VIEW COMPARISON:  None. FINDINGS: The heart size and pulmonary vascularity are normal. No infiltrates or effusions. No acute bone abnormality. IMPRESSION: No acute abnormalities. Electronically Signed   By: Francene Boyers M.D.   On: 10/30/2018 16:57    Procedures Procedures (including critical care time)  Medications Ordered in ED Medications    0.9 %  sodium chloride infusion ( Intravenous New Bag/Given 10/30/18 1646)  nitroGLYCERIN 50 mg in dextrose 5 % 250 mL (0.2 mg/mL) infusion (5 mcg/min Intravenous New Bag/Given 10/30/18 1659)  aspirin chewable tablet 324 mg (324 mg Oral Given 10/30/18 1638)  heparin injection 3,550 Units (3,550 Units Intravenous Given 10/30/18 1646)  ondansetron (ZOFRAN) injection 4 mg (4 mg Intravenous Given 10/30/18 1638)  nitroGLYCERIN (NITROSTAT) 0.4 MG SL tablet (0.4 mg  Given 10/30/18 1640)  nitroGLYCERIN (NITROSTAT) SL tablet 0.4 mg (0.4 mg Sublingual Given 10/30/18 1649)  morphine 2 MG/ML injection (2 mg Intravenous Given 10/30/18 1658)  ondansetron (ZOFRAN) 4 MG/2ML injection (4 mg  Given 10/30/18 1659)     Initial Impression / Assessment and Plan / ED Course  I have reviewed the triage vital signs and the nursing notes.  Pertinent labs & imaging results that were available during my care of the patient were reviewed by me and considered in my medical decision making (see chart for details).     58 yo F with a chief complaint chest pain.  This started this morning and is been off and on throughout the day.  On arrival to the ED patient having some improvement of her pain though has signs of an inferior ST elevation MI.  Was made a code STEMI, given heparin and nitro.  Had worsening pain while in the ED and was eventually put on a nitro drip.  Had some improvement and was taken to the Cath Lab.  CRITICAL CARE Performed by: Rae Roamaniel Patrick Lynze Reddy   Total critical care time: 80 minutes  Critical care time was exclusive of separately billable procedures and treating other patients.  Critical care was necessary to treat or prevent imminent or life-threatening deterioration.  Critical care was time spent personally by me on the following activities: development of treatment plan with patient and/or surrogate as well as nursing, discussions with consultants, evaluation of patient's response to treatment,  examination of patient, obtaining history from patient or surrogate, ordering and performing treatments and interventions, ordering and review of laboratory studies, ordering and review of radiographic studies, pulse oximetry and re-evaluation of patient's condition.  The patients results and plan were reviewed and discussed.   Any x-rays performed were independently reviewed by myself.   Differential diagnosis were considered with the presenting HPI.  Medications  0.9 %  sodium chloride infusion ( Intravenous New Bag/Given 10/30/18 1646)  nitroGLYCERIN 50 mg in dextrose 5 % 250 mL (0.2 mg/mL) infusion (5 mcg/min Intravenous New Bag/Given 10/30/18 1659)  aspirin chewable tablet 324 mg (324 mg Oral Given 10/30/18 1638)  heparin injection 3,550 Units (3,550 Units Intravenous Given 10/30/18 1646)  ondansetron (ZOFRAN) injection 4 mg (4 mg Intravenous Given 10/30/18 1638)  nitroGLYCERIN (NITROSTAT) 0.4 MG SL tablet (0.4 mg  Given 10/30/18 1640)  nitroGLYCERIN (NITROSTAT) SL tablet 0.4 mg (0.4 mg Sublingual Given 10/30/18 1649)  morphine 2 MG/ML injection (2 mg Intravenous Given 10/30/18 1658)  ondansetron (ZOFRAN) 4 MG/2ML injection (4 mg  Given 10/30/18 1659)    Vitals:   10/30/18 1645 10/30/18 1649 10/30/18 1700 10/30/18 1708  BP: (!) 143/98  (!) 143/91   Pulse: 98  (!) 110   Resp: 13  15   SpO2: 98%  94% 100%  Weight:      Height:  4\' 11"  (1.499 m)      Final diagnoses:  STEMI involving right coronary artery (HCC)    Admission/ observation were discussed with the admitting physician, patient and/or family and they are comfortable with the plan.    Final Clinical Impressions(s) / ED Diagnoses   Final diagnoses:  STEMI involving right coronary artery [facility](HCC)    ED Discharge Orders    None       Melene PlanFloyd, Latoiya Maradiaga, DO 10/30/18 1709

## 2018-10-31 ENCOUNTER — Inpatient Hospital Stay (HOSPITAL_COMMUNITY): Payer: Self-pay

## 2018-10-31 ENCOUNTER — Other Ambulatory Visit: Payer: Self-pay

## 2018-10-31 ENCOUNTER — Encounter (HOSPITAL_COMMUNITY): Payer: Self-pay | Admitting: Cardiology

## 2018-10-31 ENCOUNTER — Ambulatory Visit: Payer: Self-pay | Admitting: Internal Medicine

## 2018-10-31 DIAGNOSIS — I2119 ST elevation (STEMI) myocardial infarction involving other coronary artery of inferior wall: Secondary | ICD-10-CM

## 2018-10-31 LAB — COMPREHENSIVE METABOLIC PANEL
ALT: 28 U/L (ref 0–44)
ANION GAP: 14 (ref 5–15)
AST: 43 U/L — ABNORMAL HIGH (ref 15–41)
Albumin: 3.3 g/dL — ABNORMAL LOW (ref 3.5–5.0)
Alkaline Phosphatase: 89 U/L (ref 38–126)
BUN: 9 mg/dL (ref 6–20)
CO2: 22 mmol/L (ref 22–32)
Calcium: 8.8 mg/dL — ABNORMAL LOW (ref 8.9–10.3)
Chloride: 103 mmol/L (ref 98–111)
Creatinine, Ser: 0.56 mg/dL (ref 0.44–1.00)
GFR calc Af Amer: >60 mL/min (ref >60)
GFR calc non Af Amer: >60 mL/min (ref >60)
Glucose, Bld: 164 mg/dL — ABNORMAL HIGH (ref 70–99)
Potassium: 3.3 mmol/L — ABNORMAL LOW (ref 3.5–5.1)
Sodium: 139 mmol/L (ref 135–145)
Total Bilirubin: 0.6 mg/dL (ref 0.3–1.2)
Total Protein: 6.7 g/dL (ref 6.5–8.1)

## 2018-10-31 LAB — ECHOCARDIOGRAM COMPLETE
Height: 59 in
WEIGHTICAEL: 2070.56 [oz_av]

## 2018-10-31 LAB — CBC
HCT: 43.1 % (ref 36.0–46.0)
Hemoglobin: 13.8 g/dL (ref 12.0–15.0)
MCH: 27.3 pg (ref 26.0–34.0)
MCHC: 32 g/dL (ref 30.0–36.0)
MCV: 85.3 fL (ref 80.0–100.0)
NRBC: 0 % (ref 0.0–0.2)
Platelets: 231 10*3/uL (ref 150–400)
RBC: 5.05 MIL/uL (ref 3.87–5.11)
RDW: 13 % (ref 11.5–15.5)
WBC: 11.1 10*3/uL — AB (ref 4.0–10.5)

## 2018-10-31 LAB — TROPONIN I
Troponin I: 2.68 ng/mL (ref <0.03)
Troponin I: 2.73 ng/mL (ref <0.03)

## 2018-10-31 LAB — GLUCOSE, CAPILLARY
GLUCOSE-CAPILLARY: 154 mg/dL — AB (ref 70–99)
Glucose-Capillary: 207 mg/dL — ABNORMAL HIGH (ref 70–99)
Glucose-Capillary: 240 mg/dL — ABNORMAL HIGH (ref 70–99)
Glucose-Capillary: 136 mg/dL — ABNORMAL HIGH (ref 70–99)

## 2018-10-31 LAB — MAGNESIUM: Magnesium: 1.9 mg/dL (ref 1.7–2.4)

## 2018-10-31 MED ORDER — METOPROLOL SUCCINATE ER 25 MG PO TB24
25.0000 mg | ORAL_TABLET | Freq: Every day | ORAL | Status: DC
  Administered 2018-10-31 – 2018-11-03 (×4): 25 mg via ORAL
  Filled 2018-10-31 (×4): qty 1

## 2018-10-31 MED ORDER — POTASSIUM CHLORIDE CRYS ER 20 MEQ PO TBCR
40.0000 meq | EXTENDED_RELEASE_TABLET | Freq: Once | ORAL | Status: AC
  Administered 2018-10-31: 40 meq via ORAL
  Filled 2018-10-31: qty 2

## 2018-10-31 NOTE — Plan of Care (Signed)
  Problem: Clinical Measurements: Goal: Ability to maintain clinical measurements within normal limits will improve Outcome: Progressing Goal: Will remain free from infection Outcome: Progressing   Problem: Elimination: Goal: Will not experience complications related to bowel motility Outcome: Progressing Goal: Will not experience complications related to urinary retention Outcome: Progressing   Problem: Pain Managment: Goal: General experience of comfort will improve Outcome: Progressing   Problem: Activity: Goal: Ability to tolerate increased activity will improve Outcome: Progressing   Problem: Cardiac: Goal: Ability to achieve and maintain adequate cardiovascular perfusion will improve Outcome: Progressing

## 2018-10-31 NOTE — Progress Notes (Addendum)
Progress Note  Patient Name: Stephanie Cooke Date of Encounter: 10/31/2018  Primary Cardiologist: Peter SwazilandJordan, MD  Subjective   Pt speaks only [place] Arabic, no translator available, son not present.  Pt able to indicate that she is not having pain, chest is ok. No problems breathing.   Inpatient Medications    Scheduled Meds: . aspirin EC  81 mg Oral Daily  . atorvastatin  80 mg Oral q1800  . enoxaparin (LOVENOX) injection  40 mg Subcutaneous Q24H  . insulin aspart  0-15 Units Subcutaneous TID WC  . insulin aspart  0-5 Units Subcutaneous QHS  . sodium chloride flush  3 mL Intravenous Q12H  . sodium chloride flush  3 mL Intravenous Q12H  . ticagrelor  90 mg Oral BID   Continuous Infusions: . sodium chloride 59 mL/hr at 10/30/18 1822  . sodium chloride    . sodium chloride     PRN Meds: sodium chloride, sodium chloride, acetaminophen, ALPRAZolam, nitroGLYCERIN, ondansetron (ZOFRAN) IV, sodium chloride flush, sodium chloride flush, zolpidem   Vital Signs    Vitals:   10/31/18 0900 10/31/18 1000 10/31/18 1100 10/31/18 1200  BP: 133/65 132/76 121/70 133/72  Pulse: 82 79 79 100  Resp: 17 15 16 16   Temp:      TempSrc:      SpO2: 97% 97% 96% 97%  Weight:      Height:        Intake/Output Summary (Last 24 hours) at 10/31/2018 1304 Last data filed at 10/31/2018 1200 Gross per 24 hour  Intake 1070.4 ml  Output 1100 ml  Net -29.6 ml   Filed Weights   10/30/18 1600 10/31/18 0500  Weight: 59 kg 58.7 kg    Telemetry    SR, ST w/ 7 bt run NSVT last pm - Personally Reviewed  ECG    10/31/2018 ECG is sinus rhythm, heart rate 83, inferior Q waves are new but ST elevation has resolved- Personally Reviewed  Physical Exam   General: Well developed, well nourished, female appearing in no acute distress. Head: Normocephalic, atraumatic.  Neck: Supple without bruits, JVD not elevated. Lungs:  Resp regular and unlabored, CTA. Heart: RRR, S1, S2, no S3, S4, or murmur;  no rub. Abdomen: Soft, non-tender, non-distended with normoactive bowel sounds. No hepatomegaly. No rebound/guarding. No obvious abdominal masses. Extremities: No clubbing, cyanosis, no edema. Distal pedal pulses are 2+ bilaterally. R radial cath site w/out ecchymosis or hematoma Neuro: Alert and oriented X 3. Moves all extremities spontaneously. Psych: Normal affect.  Labs    Hematology Recent Labs  Lab 10/30/18 1635 10/30/18 1723 10/31/18 0729  WBC 12.8*  --  11.1*  RBC 5.64*  --  5.05  HGB 15.7* 13.9 13.8  HCT 47.9* 41.0 43.1  MCV 84.9  --  85.3  MCH 27.8  --  27.3  MCHC 32.8  --  32.0  RDW 12.5  --  13.0  PLT 271  --  231    Chemistry Recent Labs  Lab 10/30/18 1635 10/30/18 1723 10/31/18 0235  NA 139 140 139  K 3.2* 3.1* 3.3*  CL 102  --  103  CO2 21*  --  22  GLUCOSE 234*  --  164*  BUN 12  --  9  CREATININE 0.62 0.40* 0.56  CALCIUM 9.6  --  8.8*  PROT 7.7  --  6.7  ALBUMIN 3.9  --  3.3*  AST 31  --  43*  ALT 27  --  28  ALKPHOS  103  --  89  BILITOT 0.7  --  0.6  GFRNONAA >60  --  >60  GFRAA >60  --  >60  ANIONGAP 16*  --  14     Cardiac Enzymes Recent Labs  Lab 10/30/18 1635 10/30/18 2041 10/31/18 0235 10/31/18 0729  TROPONINI <0.03 0.83* 2.68* 2.73*   No results for input(s): TROPIPOC in the last 168 hours.   Lab Results  Component Value Date   CHOL 227 (H) 10/30/2018   HDL 50 10/30/2018   LDLCALC 146 (H) 10/30/2018   TRIG 153 (H) 10/30/2018   CHOLHDL 4.5 10/30/2018   Lab Results  Component Value Date   HGBA1C 11.0 (H) 10/30/2018   [facility]: [facility]   Radiology    Dg Chest Port 1 View  Result Date: 10/30/2018 CLINICAL DATA:  Chest pain. EXAM: PORTABLE CHEST 1 VIEW COMPARISON:  None. FINDINGS: The heart size and pulmonary vascularity are normal. No infiltrates or effusions. No acute bone abnormality. IMPRESSION: No acute abnormalities. Electronically Signed   By: Francene BoyersJames  Maxwell M.D.   On: 10/30/2018 16:57     Cardiac  Studies   ECHO: Ordered  CARDIAC CATH: 10/30/2018  2nd Mrg lesion is 99% stenosed.  Ost 1st Mrg to 1st Mrg lesion is 85% stenosed.  Prox RCA lesion is 70% stenosed.  Post intervention, there is a 0% residual stenosis.  A drug-eluting stent was successfully placed using a STENT SYNERGY DES 2.25X16.  Post intervention, there is a 0% residual stenosis.  A drug-eluting stent was successfully placed using a STENT SYNERGY DES 2.25X16.  The left ventricular systolic function is normal.  LV end diastolic pressure is normal.  The left ventricular ejection fraction is 55-65% by visual estimate.   1. 3 vessel obstructive CAD.    - 85% first OM    -99% second OM. This is the culprit lesion    - 70% proximal RCA 2. Normal LV function 3. Normal LVEDP 4. Successful PCI of the second OM with DES x 1 5. Successful PCI of the first OM with DES x 1.   Plan; DAPT x 1 year. I would treat residual disease in the RCA initially with medical therapy. If she has refractory chest pain this could be treated with PCI. Anticipate fast track DC.   Intervention       Patient Profile     58 y.o. female w/ hx HTN, HLD, DM, was admitted 01/27 w/ inferior STEMI.  Assessment & Plan    Principal Problem: 1.  STEMI involving oth coronary artery of inferior wall (HCC) -Cardiac rehab needs to see but family needs to be present to translate - She is on aspirin, Brilinta, high-dose statin -Add beta-blocker - Follow-up on echo results, ambulate and see how tolerated.  Active Problems: 2.  New onset type 2 diabetes mellitus (HCC) (dx < 1 yr ago) -A1c is significantly elevated at 11. -She has IM appointment on 2/3. - We will leave diabetes management to them, continue prior to admission metformin after discharge and follow-up as scheduled  3.  HTN (hypertension) - Systolic blood pressure 120s-140s - no rx pta -Believe she will tolerate a low-dose beta-blocker  4.  Hyperlipidemia LDL goal  <70 -Continue high-dose statin, recheck lipids and liver functions in 6-12 weeks  5. NSVT:  - Add BB and supplement K+  6. Hypokalemia - supplement and recheck in am.  Review w/ MD, believe ok to tx telemetry, possible d/c in am.  Otherwise, see  above   STEMI involving left circumflex coronary artery [facility])   STEMI (ST elevation myocardial infarction) (HCC)   Signed, Theodore Demark , PA-C 1:04 PM 10/31/2018 Pager: [phone]  ATTENDING ATTESTATION  I have seen, examined and evaluated the patient this PM along with Theodore Demark, PA-C.  After reviewing all the available data and chart, we discussed the patients laboratory, study & physical findings as well as symptoms in detail. I agree with her findings, examination as well as impression recommendations as per our discussion.    Echocardiogram currently being done upon my evaluation. When I saw her she was resting comfortably.  She had no indication of chest pain.  Nurse indicates that with discussion with her son that she had denied any chest pain or pressure.  Had a headache this morning and that is all. Exam is benign.  With minimal troponin elevation in the setting of what appeared to be a STEMI, this is consistent with the fact that she did not have occluded circumflex or OM branches but significant lesions only.  Would suspect that her EF will likely be stable on echo. Agree with statin and beta-blocker.  She definitely needs to have diabetic education and counseling as well as establishing a stable regimen. We have asked for diabetic education team and counseling.  We will need to have family present at bedside for cardiac rehab.  Once she is able to ambulate in the hallway with cardiac rehab, I think she could be stable for transferring out to telemetry.  Would likely be ready for discharge tomorrow, but will need communication with her son based on the fact that her [place] dialect is not one that is readily available  on the computer interpreter.   Bryan Lemma, M.D., M.S. Interventional Cardiologist   Pager # [phone] Phone # [phone] [address] [place], [place] [address]

## 2018-10-31 NOTE — Care Management Note (Addendum)
Case Management Note  Patient Details  Name: Stephanie Cooke MRN: [id number] Date of Birth: Mar 28, 1961  Subjective/Objective: 58 yo female presented with STEMI on 10/30/18; s/p cath with PCI.                   Action/Plan: CM consult acknowledged for med assist. CM contacted patients PCP Dr. Julieanne Manson ([facility]) to discuss regimen of Brilinta post transition. Per Dr. Delrae Alfred, patient can apply for the MAP Program/Orange Card with [facility] Department to assist with Brilinta cost and a SW will be available at the Health Department to provide assistance with her application. Dr. Delrae Alfred indicated patient has a history of medication non-adherence and only administers her medications PRN, even though her medications are on the $4 list. Patient had a previously scheduled PCP appointment at [facility] today; CM has rescheduled patients appointment for Dr. Georga [facility] on: 11/06/18 @ 1430; AVS updated. TOC pharmacy will provide medications prior to patient transitioning home. CM will discuss with patients son and will continue to follow.    Expected Discharge Date:                  Expected Discharge Plan:  Home/Self Care  In-House Referral:  NA  Discharge planning Services  CM Consult, Medication Assistance, Follow-up appt scheduled(Brilinta )  Post Acute Care Choice:  NA Choice offered to:  NA  DME Arranged:  N/A DME Agency:  NA  HH Arranged:  NA HH Agency:  NA  Status of Service:  In process, will continue to follow  If discussed at Long Length of Stay Meetings, dates discussed:    Additional Comments:  Colleen Can RN, BSN, NCM-BC, ACM-RN [phone] 10/31/2018, 12:11 PM

## 2018-10-31 NOTE — Progress Notes (Signed)
Inpatient Diabetes Program Recommendations  AACE/ADA: New Consensus Statement on Inpatient Glycemic Control (2015)  Target Ranges:  Prepandial:   less than 140 mg/dL      Peak postprandial:   less than 180 mg/dL (1-2 hours)      Critically ill patients:  140 - 180 mg/dL   Lab Results  Component Value Date   GLUCAP 207 (H) 10/31/2018   HGBA1C 11.0 (H) 10/30/2018    Review of Glycemic Control Results for Stephanie Cooke, Stephanie Cooke (MRN [id number]) as of 10/31/2018 11:20  Ref. Range 10/30/2018 21:35 10/31/2018 06:28  Glucose-Capillary Latest Ref Range: 70 - 99 mg/dL 612 (H) 244 (H)   Diabetes history: DM2 Outpatient Diabetes medications: Metformin 500 mg bid Current orders for Inpatient glycemic control: Novolog moderate correction tid + hs 0-5  Inpatient Diabetes Program Recommendations:   -Start Lantus 10 units daily -Novolog 3 units tid meal coverage if eats 50% -Decrease Novolog correction to sensitive tid  DM coordinator taught patient and son insulin administration on last admission 01-06-18 and reviewed Novolin 70/30 insulin mix bid available @ [facility] with them. Patient was discharged on Metformin and Amaryl and Amaryl discontinued per patient after discharge. Hgb A1c was 11.7 01/06/18 and currently 11.0. Will plan to see patient to discuss diabetes management when appropriate.  Thank you, Stephanie Cooke. Sanvi Ehler, RN, MSN, CDE  Diabetes Coordinator Inpatient Glycemic Control Team Team Pager [phone] (8am-5pm) 10/31/2018 11:29 AM

## 2018-10-31 NOTE — Progress Notes (Signed)
  Echocardiogram 2D Echocardiogram has been performed.  Stephanie Cooke 10/31/2018, 2:54 PM

## 2018-10-31 NOTE — Progress Notes (Addendum)
CARDIAC REHAB PHASE I   PRE:  Rate/Rhythm: 79 SR  BP:  Supine: 153/85  Sitting:   Standing:    SaO2: 98%RA  MODE:  Ambulation: 370 ft   POST:  Rate/Rhythm: 109 ST  1450-1505 No indication of CP with walk. Pt walked 370 ft on RA with hand held asst. To bathroom after walk and NT in room with pt. Will return tomorrow and ed when family here.    Luetta Nutting, RN BSN  10/31/2018 3:01 PM

## 2018-11-01 LAB — BASIC METABOLIC PANEL
Anion gap: 10 (ref 5–15)
BUN: 5 mg/dL — ABNORMAL LOW (ref 6–20)
CO2: 25 mmol/L (ref 22–32)
Calcium: 9.3 mg/dL (ref 8.9–10.3)
Chloride: 103 mmol/L (ref 98–111)
Creatinine, Ser: 0.53 mg/dL (ref 0.44–1.00)
GFR calc Af Amer: >60 mL/min (ref >60)
GFR calc non Af Amer: >60 mL/min (ref >60)
Glucose, Bld: 198 mg/dL — ABNORMAL HIGH (ref 70–99)
Potassium: 3.6 mmol/L (ref 3.5–5.1)
Sodium: 138 mmol/L (ref 135–145)

## 2018-11-01 LAB — GLUCOSE, CAPILLARY
Glucose-Capillary: 208 mg/dL — ABNORMAL HIGH (ref 70–99)
Glucose-Capillary: 199 mg/dL — ABNORMAL HIGH (ref 70–99)
Glucose-Capillary: 158 mg/dL — ABNORMAL HIGH (ref 70–99)
Glucose-Capillary: 250 mg/dL — ABNORMAL HIGH (ref 70–99)

## 2018-11-01 MED ORDER — LIVING WELL WITH DIABETES BOOK
Freq: Once | Status: AC
  Administered 2018-11-01: 16:00:00
  Filled 2018-11-01: qty 1

## 2018-11-01 MED ORDER — POTASSIUM CHLORIDE CRYS ER 20 MEQ PO TBCR
40.0000 meq | EXTENDED_RELEASE_TABLET | Freq: Once | ORAL | Status: AC
  Administered 2018-11-01: 40 meq via ORAL
  Filled 2018-11-01: qty 2

## 2018-11-01 MED ORDER — INSULIN STARTER KIT- SYRINGES (ENGLISH)
1.0000 | Freq: Once | Status: AC
  Administered 2018-11-01: 1
  Filled 2018-11-01: qty 1

## 2018-11-01 MED ORDER — GUAIFENESIN-DM 100-10 MG/5ML PO SYRP
15.0000 mL | ORAL_SOLUTION | ORAL | Status: DC | PRN
  Administered 2018-11-01: 15 mL via ORAL
  Filled 2018-11-01: qty 15

## 2018-11-01 NOTE — Progress Notes (Signed)
CARDIAC REHAB PHASE I   PRE:  Rate/Rhythm: 85 SR  BP:  Supine:   Sitting: 127/77  Standing:    SaO2: 98%RA  MODE:  Ambulation: 400 ft   POST:  Rate/Rhythm: 87 SR  BP:  Supine:   Sitting: 139/95  Standing:    SaO2: 95%RA 1325-1420 Pt walked 400 ft on RA with hand held asst. No CP. To bed after walk. Son interpreted for me and he has signed sheet on chart for interpretation. Stressed importance of brilinta with stent. Reviewed MI restrictions, NTG use, watching carbs as A1C at 11, walking for ex, heart healthy food choices and CRP 2. Son stated pt eats a lot of bread. Discussed trying to make some changes. Heart healthy and diabetic diets given. Son stated that pt has not been very active. Stressed importance of taking meds and also trying to increase ex.  Discussed CRP 2 and referred to [facility] but pt will not have transportation as son works and goes to school.    Luetta Nutting, RN BSN  11/01/2018 2:17 PM

## 2018-11-01 NOTE — Progress Notes (Addendum)
Inpatient Diabetes Program Recommendations  AACE/ADA: New Consensus Statement on Inpatient Glycemic Control (2015)  Target Ranges:  Prepandial:   less than 140 mg/dL      Peak postprandial:   less than 180 mg/dL (1-2 hours)      Critically ill patients:  140 - 180 mg/dL   Lab Results  Component Value Date   GLUCAP 199 (H) 11/01/2018   HGBA1C 11.0 (H) 10/30/2018    Review of Glycemic Control  Inpatient Diabetes Program Recommendations:   Spoke with pharmacist Lattie Haw to discuss insulin options for home insulin regimen. Due to patient not eating well, -NPH 4 units bid ac breakfast & supper May transition to 70/30 insulin mix when eating. Patient does not have insurance so will be able to purchase Novolin insulin from [facility] for approx. $25 per vial. Noted patient applying for orange card to get medications. 12:45 Met with patient and son@ bedside to discuss diabetes management. Reviewed with patient and son administration of insulin from vial to giving injections. Also reviewed site rotation and timing of giving insulin injections. Reviewed A1c and patient's level of 11.0 is elevated for average blood glucose of 269.  Son interpreted for patient and speaks fluent [place].  RN: Please begin insulin teaching with this patient. Please allow pt to practice drawing up and giving injections as much as possible and document. Please review hypoglycemia protocol with patient.  Thank you, Nani Gasser. Kai Calico, RN, MSN, CDE  Diabetes Coordinator Inpatient Glycemic Control Team Team Pager [phone] (8am-5pm) 11/01/2018 11:33 AM

## 2018-11-01 NOTE — Progress Notes (Signed)
Progress Note  Patient Name: Stephanie Cooke Date of Encounter: 11/01/2018  Primary Cardiologist: Peter Swaziland, MD  Subjective   Pt speaks only [place] Arabic, no translator available, son now present.   Patient is doing well today.  No major complaints.  No chest pain or dyspnea.  Eating okay, but not a lot.  Has walked in the hallways.  Has not yet worked with cardiac rehab today.  DM team saw her - recommendations made    Inpatient Medications    Scheduled Meds: . aspirin EC  81 mg Oral Daily  . atorvastatin  80 mg Oral q1800  . enoxaparin (LOVENOX) injection  40 mg Subcutaneous Q24H  . insulin aspart  0-15 Units Subcutaneous TID WC  . insulin aspart  0-5 Units Subcutaneous QHS  . metoprolol succinate  25 mg Oral Daily  . sodium chloride flush  3 mL Intravenous Q12H  . sodium chloride flush  3 mL Intravenous Q12H  . ticagrelor  90 mg Oral BID   Continuous Infusions: . sodium chloride 59 mL/hr at 10/30/18 1822  . sodium chloride    . sodium chloride     PRN Meds: sodium chloride, sodium chloride, acetaminophen, ALPRAZolam, nitroGLYCERIN, ondansetron (ZOFRAN) IV, sodium chloride flush, sodium chloride flush, zolpidem   Vital Signs    Vitals:   11/01/18 0700 11/01/18 0725 11/01/18 0800 11/01/18 0903  BP:  128/79 113/74 116/68  Pulse:  74  81  Resp:      Temp: 98 F (36.7 C)     TempSrc: Oral     SpO2:  97%    Weight:      Height:        Intake/Output Summary (Last 24 hours) at 11/01/2018 1109 Last data filed at 11/01/2018 1000 Gross per 24 hour  Intake 580 ml  Output -  Net 580 ml   Filed Weights   10/30/18 1600 10/31/18 0500 11/01/18 0530  Weight: 59 kg 58.7 kg 56.6 kg    Telemetry    Mostly sinus rhythm and sinus tachycardia but no further VT.- Personally Reviewed  ECG    Personally reviewed: Sinus rhythm, rate 86 bpm.  Incomplete RBBB with suggestion of possible inferior infarct, recent.  T wave inversions in lead III more prominent (likely  evolutionary changes of recent inferior STEMI)  Physical Exam   Physical Exam  Constitutional: She is oriented to person, place, and time. She appears well-developed and well-nourished. No distress.  Pleasant.  Shy.  HENT:  Head: Normocephalic and atraumatic.  Neck: Normal range of motion. Neck supple. No JVD present.  Cardiovascular: Normal rate, regular rhythm, normal heart sounds and intact distal pulses. Exam reveals no gallop and no friction rub.  No murmur heard. Pulmonary/Chest: Effort normal and breath sounds normal. No respiratory distress. She has no wheezes. She has no rales.  Abdominal: Soft. Bowel sounds are normal. She exhibits no distension. There is no abdominal tenderness.  Musculoskeletal: Normal range of motion.        General: No edema (No clubbing or cyanosis).     Comments: Radial cath site C-D-I  Neurological: She is alert and oriented to person, place, and time.  Psychiatric: She has a normal mood and affect. Her behavior is normal.  Nursing note and vitals reviewed.   Labs    Hematology Recent Labs  Lab 10/30/18 1635 10/30/18 1723 10/31/18 0729  WBC 12.8*  --  11.1*  RBC 5.64*  --  5.05  HGB 15.7* 13.9 13.8  HCT 47.9*  41.0 43.1  MCV 84.9  --  85.3  MCH 27.8  --  27.3  MCHC 32.8  --  32.0  RDW 12.5  --  13.0  PLT 271  --  231    Chemistry Recent Labs  Lab 10/30/18 1635 10/30/18 1723 10/31/18 0235 11/01/18 0331  NA 139 140 139 138  K 3.2* 3.1* 3.3* 3.6  CL 102  --  103 103  CO2 21*  --  22 25  GLUCOSE 234*  --  164* 198*  BUN 12  --  9 5*  CREATININE 0.62 0.40* 0.56 0.53  CALCIUM 9.6  --  8.8* 9.3  PROT 7.7  --  6.7  --   ALBUMIN 3.9  --  3.3*  --   AST 31  --  43*  --   ALT 27  --  28  --   ALKPHOS 103  --  89  --   BILITOT 0.7  --  0.6  --   GFRNONAA >60  --  >60 >60  GFRAA >60  --  >60 >60  ANIONGAP 16*  --  14 10     Cardiac Enzymes Recent Labs  Lab 10/30/18 1635 10/30/18 2041 10/31/18 0235 10/31/18 0729  TROPONINI  <0.03 0.83* 2.68* 2.73*   No results for input(s): TROPIPOC in the last 168 hours.   Lab Results  Component Value Date   CHOL 227 (H) 10/30/2018   HDL 50 10/30/2018   LDLCALC 146 (H) 10/30/2018   TRIG 153 (H) 10/30/2018   CHOLHDL 4.5 10/30/2018   Lab Results  Component Value Date   HGBA1C 11.0 (H) 10/30/2018   No results found for: [facility]   Radiology    Dg Chest Port 1 View  Result Date: 10/30/2018 CLINICAL DATA:  Chest pain. EXAM: PORTABLE CHEST 1 VIEW COMPARISON:  None. FINDINGS: The heart size and pulmonary vascularity are normal. No infiltrates or effusions. No acute bone abnormality. IMPRESSION: No acute abnormalities. Electronically Signed   By: Francene BoyersJames  Maxwell M.D.   On: 10/30/2018 16:57     Cardiac Studies   ECHO: EF 60-65%, indeterminate diastolic pattern.  No RWMA. Otherwise normal valves.  CARDIAC CATH: 10/30/2018  1. 3 vessel obstructive CAD.:    - 85% first OM,  -99% second OM = culprit lesion;  - 70% proximal RCA 2. Normal LV function; Normal LVEDP 4. Successful PCI of the second OM with DES x 1 - SYNERGY DES 2.25X16. 5. Successful PCI of the first OM with DES x 1 - SYNERGY DES 2.25X16.  Plan; DAPT x 1 year. I would treat residual disease in the RCA initially with medical therapy. If she has refractory chest pain this could be treated with PCI. Anticipate fast track DC.   Intervention       Patient Profile     58 y.o. female w/ hx HTN, HLD, DM, was admitted 01/27 w/ inferior STEMI.  Assessment & Plan    Principal Problem: 1.  STEMI involving oth coronary artery of inferior wall (HCC) - thankfully not 100% of either  -Cardiac rehab needs to see but family needs to be present to translate - SON HERE NOW - WILL DISCUSS PLANS - ASA, Brilinta (although may need to convert to Plavix if $ issues - PCP may be able to help with OP sourcing) - low dose Beta Blocker -Echo with stable EF (suggests that physiologically more c/w NSTEMI)  Active Problems: DM  management team consulted - appreciate input. 2.  Type  2 diabetes mellitus (HCC) (dx < 1 yr ago) - A1c ~11.  -She has IM appointment on 2/3. - will need insulinon d/c now with CAD-MI.  -- will plan to start 70/30 (instead of Lantus & meal coverage) since this is more consistent with probable home regimen.  --> will adjust meds today & in AM.  3.  HTN (hypertension) - stable BPs ~120s-140s - no rx pta -Believe she will tolerate a low-dose beta-blocker - would not titrate further.  4.  Hyperlipidemia LDL goal <70 -High dose statin, recheck lipids and liver functions in 6-12 weeks  5. NSVT: no further episodes - Add BB and supplement K+  6. Hypokalemia - supplemented - still 3.6 --> will give additional 40 meq and recheck in am.   telemetry, possible d/c in am.  Preventing fast track discharge is her significant A1c and communication barriers for establishing a good regimen. With  A1c 11 - need to solidify DM treatment prior to d/c - anticipate in AM.  (will try to set up [facility] to help with OP management).    Bryan Lemmaavid Klaus Casteneda, M.D., M.S. Interventional Cardiologist   Pager # [phone] Phone # [phone] [address] [place], [place] [address]

## 2018-11-02 DIAGNOSIS — Z794 Long term (current) use of insulin: Secondary | ICD-10-CM

## 2018-11-02 DIAGNOSIS — Z955 Presence of coronary angioplasty implant and graft: Secondary | ICD-10-CM

## 2018-11-02 DIAGNOSIS — E118 Type 2 diabetes mellitus with unspecified complications: Secondary | ICD-10-CM

## 2018-11-02 LAB — GLUCOSE, CAPILLARY
Glucose-Capillary: 247 mg/dL — ABNORMAL HIGH (ref 70–99)
Glucose-Capillary: 218 mg/dL — ABNORMAL HIGH (ref 70–99)
Glucose-Capillary: 293 mg/dL — ABNORMAL HIGH (ref 70–99)
Glucose-Capillary: 70 mg/dL (ref 70–99)

## 2018-11-02 LAB — BASIC METABOLIC PANEL
Anion gap: 8 (ref 5–15)
BUN: 8 mg/dL (ref 6–20)
CHLORIDE: 106 mmol/L (ref 98–111)
CO2: 23 mmol/L (ref 22–32)
Calcium: 9 mg/dL (ref 8.9–10.3)
Creatinine, Ser: 0.64 mg/dL (ref 0.44–1.00)
GFR calc non Af Amer: >60 mL/min (ref >60)
Glucose, Bld: 200 mg/dL — ABNORMAL HIGH (ref 70–99)
Potassium: 3.7 mmol/L (ref 3.5–5.1)
Sodium: 137 mmol/L (ref 135–145)

## 2018-11-02 MED ORDER — INSULIN ASPART PROT & ASPART (70-30 MIX) 100 UNIT/ML ~~LOC~~ SUSP
6.0000 [IU] | Freq: Two times a day (BID) | SUBCUTANEOUS | Status: DC
  Administered 2018-11-02 – 2018-11-03 (×2): 6 [IU] via SUBCUTANEOUS
  Filled 2018-11-02: qty 10

## 2018-11-02 NOTE — Progress Notes (Signed)
Progress Note  Patient Name: Stephanie Cooke Date of Encounter: 11/02/2018  Primary Cardiologist: Peter Swaziland, MD  Subjective   Unable to communicate this a.m.  Son is not at the bedside. 70/30 not currently ordered>>will have interpreter to bedside to initiate insulin teaching.  Inpatient Medications    Scheduled Meds: . aspirin EC  81 mg Oral Daily  . atorvastatin  80 mg Oral q1800  . enoxaparin (LOVENOX) injection  40 mg Subcutaneous Q24H  . insulin aspart  0-15 Units Subcutaneous TID WC  . insulin aspart  0-5 Units Subcutaneous QHS  . metoprolol succinate  25 mg Oral Daily  . sodium chloride flush  3 mL Intravenous Q12H  . sodium chloride flush  3 mL Intravenous Q12H  . ticagrelor  90 mg Oral BID   Continuous Infusions: . sodium chloride 59 mL/hr at 10/30/18 1822  . sodium chloride    . sodium chloride     PRN Meds: sodium chloride, sodium chloride, acetaminophen, ALPRAZolam, guaiFENesin-dextromethorphan, nitroGLYCERIN, ondansetron (ZOFRAN) IV, sodium chloride flush, sodium chloride flush, zolpidem   Vital Signs    Vitals:   11/01/18 1123 11/01/18 1448 11/01/18 2006 11/02/18 0415  BP:  113/72 127/75 127/80  Pulse:  75 94 75  Resp:  19 (!) 26 14  Temp: 98.4 F (36.9 C) 98.2 F (36.8 C) 98.7 F (37.1 C) 98.1 F (36.7 C)  TempSrc: Oral Oral Oral Oral  SpO2:  97% 97% 98%  Weight:    55.8 kg  Height:        Intake/Output Summary (Last 24 hours) at 11/02/2018 0957 Last data filed at 11/02/2018 0412 Gross per 24 hour  Intake 703 ml  Output -  Net 703 ml   Filed Weights   10/31/18 0500 11/01/18 0530 11/02/18 0415  Weight: 58.7 kg 56.6 kg 55.8 kg   Physical Exam   General: Well developed, well nourished, NAD Skin: Warm, dry, intact  Head: Normocephalic, atraumatic, clear, moist mucus membranes. Neck: Negative for carotid bruits. No JVD Lungs:Clear to ausculation bilaterally. No wheezes, rales, or rhonchi. Breathing is unlabored. Cardiovascular: RRR  with S1 S2. No murmurs, rubs, gallops, or LV heave appreciated. Abdomen: Soft, non-tender, non-distended with normoactive bowel sounds. No obvious abdominal masses. MSK: Strength and tone appear normal for age. 5/5 in all extremities Extremities: No edema. No clubbing or cyanosis. DP/PT pulses 2+ bilaterally Neuro: Alert and oriented. No focal deficits. No facial asymmetry. MAE spontaneously. Psych: Responds to questions appropriately with normal affect.    Labs    Chemistry Recent Labs  Lab 10/30/18 1635  10/31/18 0235 11/01/18 0331 11/02/18 0106  NA 139   < > 139 138 137  K 3.2*   < > 3.3* 3.6 3.7  CL 102  --  103 103 106  CO2 21*  --  22 25 23   GLUCOSE 234*  --  164* 198* 200*  BUN 12  --  9 5* 8  CREATININE 0.62   < > 0.56 0.53 0.64  CALCIUM 9.6  --  8.8* 9.3 9.0  PROT 7.7  --  6.7  --   --   ALBUMIN 3.9  --  3.3*  --   --   AST 31  --  43*  --   --   ALT 27  --  28  --   --   ALKPHOS 103  --  89  --   --   BILITOT 0.7  --  0.6  --   --  GFRNONAA >60  --  >60 >60 >60  GFRAA >60  --  >60 >60 >60  ANIONGAP 16*  --  14 10 8    < > = values in this interval not displayed.     Hematology Recent Labs  Lab 10/30/18 1635 10/30/18 1723 10/31/18 0729  WBC 12.8*  --  11.1*  RBC 5.64*  --  5.05  HGB 15.7* 13.9 13.8  HCT 47.9* 41.0 43.1  MCV 84.9  --  85.3  MCH 27.8  --  27.3  MCHC 32.8  --  32.0  RDW 12.5  --  13.0  PLT 271  --  231    Cardiac Enzymes Recent Labs  Lab 10/30/18 1635 10/30/18 2041 10/31/18 0235 10/31/18 0729  TROPONINI <0.03 0.83* 2.68* 2.73*   No results for input(s): TROPIPOC in the last 168 hours.   BNPNo results for input(s): BNP, PROBNP in the last 168 hours.   DDimer No results for input(s): DDIMER in the last 168 hours.   Radiology    No results found.  Telemetry    11/02/2018 NSR- Personally Reviewed  ECG    No new tracings as of 11/02/2018- Personally Reviewed  Cardiac Studies   ECHO: EF 60-65%, indeterminate diastolic  pattern.  No RWMA. Otherwise normal valves.  CARDIAC CATH: 10/30/2018 1. 3 vessel obstructive CAD.: - 85% first OM, -99% second OM = culprit lesion; - 70% proximal RCA 2. Normal LV function; Normal LVEDP 4. Successful PCI of the second OM with DES x 1 - SYNERGY DES 2.25X16. 5. Successful PCI of the first OM with DES x 1 - SYNERGY DES 2.25X16.  Plan; DAPT x 1 year. I would treat residual disease in the RCA initially with medical therapy. If she has refractory chest pain this could be treated with PCI. Anticipate fast track DC.   Intervention       Patient Profile     58 y.o. female w/ hx HTN, HLD, DM, was admitted 01/27 w/ inferior STEMI.  Assessment & Plan    1.  STEMI involving oth coronary artery of inferior wall: -Cardiac rehab saw patient 11/01/2018 with son present for teaching purposes.   -Continue ASA and Brilinta (although may need to convert to Plavix if $ issues - PCP may be able to help with OP sourcing) -Continue low dose Beta Blocker -Echo with stable EF (suggests that physiologically more c/w NSTEMI)  2.  Type 2 diabetes mellitus: -HbA1c, 11.0>>> ongoing issue, see below -DM coordinator following with plans for initiation of insulin therapy, self injection -Plan for bedside interpreter for insulin initiation, insulin teaching.  Per DM coordinator, please allow pt to practice drawing up and giving injections as much as possible and document -Plan to start 70/30 (instead of Lantus & meal coverage)>>>not currently ordered   -DM coordinator to follow  3.  HTN: -Stable, 127/80, 127/75, 113/72 -Continue low-dose beta-blocker believe she will tolerate a low-dose beta-blocker -Would not titrate further.  4.  Hyperlipidemia: -LDL goal <70 -Continue high dose statin, recheck lipids and liver functions in 6-12 weeks  5. NSVT:  -No further episodes  6. Hypokalemia: -Stable this a.m., 3.7  Signed, Georgie ChardJill McDaniel NP-C [facility] Pager:  [phone] 11/02/2018, 9:57 AM     For questions or updates, please contact   Please consult [email or web address] for contact info under Cardiology/STEMI.

## 2018-11-02 NOTE — Progress Notes (Signed)
Inpatient Diabetes Program Recommendations  AACE/ADA: New Consensus Statement on Inpatient Glycemic Control (2015)  Target Ranges:  Prepandial:   less than 140 mg/dL      Peak postprandial:   less than 180 mg/dL (1-2 hours)      Critically ill patients:  140 - 180 mg/dL   Lab Results  Component Value Date   GLUCAP 247 (H) 11/02/2018   HGBA1C 11.0 (H) 10/30/2018    Review of Glycemic ControlResults for Stephanie Cooke, Stephanie Cooke (MRN [id number]) as of 11/02/2018 11:15  Ref. Range 11/01/2018 11:20 11/01/2018 16:29 11/01/2018 21:45 11/02/2018 07:35  Glucose-Capillary Latest Ref Range: 70 - 99 mg/dL 808 (H) 811 (H) 031 (H) 247 (H)   Diabetes history: DM2 Outpatient Diabetes medications: Metformin 500 mg bid Current orders for Inpatient glycemic control: Novolog moderate correction tid + hs 0-5 Inpatient Diabetes Program Recommendations:   Patient and son were instructed on proper administration of insulin using insulin syringe by Diabetes Coordinator on 11/01/18.  Will make sure that bedside RN continue insulin administration teaching. -Please add 70/30 6 units bid  Thanks  Beryl Meager, RN, BC-ADM Inpatient Diabetes Coordinator Pager [phone] (8a-5p)

## 2018-11-02 NOTE — Social Work (Signed)
Please call [facility] at [phone] for assistance with interpreting for medical instructions/terminology.   Abigail Butts, LCSW [phone]

## 2018-11-02 NOTE — Care Management (Signed)
Please call Inter[facility] Services @ [phone] to schedule Interpreter. Thanks Gala Lewandowsky, RN,BSN Case Manager [phone]

## 2018-11-02 NOTE — Progress Notes (Signed)
Arabic interpreter will be here 1/31 at 1pm. Family is prepared to be here. See CM note for # if need to change appointment time. Emelda Brothers RN

## 2018-11-02 NOTE — Progress Notes (Signed)
CARDIAC REHAB PHASE I   PRE:  Rate/Rhythm: 73 SR  BP:  Supine: 127/98  Sitting:   Standing:    SaO2: 94%RA  MODE:  Ambulation: 470 ft   POST:  Rate/Rhythm: 84 SR  BP:  Supine: 132/72  Sitting:   Standing:    SaO2: 95%RA 1029-1043 Pt walked 470 ft on RA with hand held asst. Gait steady. Pt smiled whole walk. No indication of CP. Back to bed after walk.   Luetta Nutting, RN BSN  11/02/2018 10:39 AM

## 2018-11-03 LAB — GLUCOSE, CAPILLARY
Glucose-Capillary: 175 mg/dL — ABNORMAL HIGH (ref 70–99)
Glucose-Capillary: 247 mg/dL — ABNORMAL HIGH (ref 70–99)

## 2018-11-03 MED ORDER — INSULIN ASPART 100 UNIT/ML ~~LOC~~ SOLN: 0.0000 [IU] | Freq: Every day | SUBCUTANEOUS | 11 refills | Status: DC

## 2018-11-03 MED ORDER — ASPIRIN 81 MG PO TBEC: 81.0000 mg | DELAYED_RELEASE_TABLET | Freq: Every day | ORAL | 3 refills | Status: DC

## 2018-11-03 MED ORDER — INSULIN NPH ISOPHANE & REGULAR (70-30) 100 UNIT/ML ~~LOC~~ SUSP: SUBCUTANEOUS | 3 refills | Status: DC

## 2018-11-03 MED ORDER — NITROGLYCERIN 0.4 MG SL SUBL: 0.4000 mg | SUBLINGUAL_TABLET | SUBLINGUAL | 12 refills | Status: DC | PRN

## 2018-11-03 MED ORDER — TICAGRELOR 90 MG PO TABS: 90.0000 mg | ORAL_TABLET | Freq: Two times a day (BID) | ORAL | 11 refills | Status: DC

## 2018-11-03 MED ORDER — ATORVASTATIN CALCIUM 80 MG PO TABS: 80.0000 mg | ORAL_TABLET | Freq: Every day | ORAL | 6 refills | Status: DC

## 2018-11-03 MED ORDER — METOPROLOL SUCCINATE ER 25 MG PO TB24: 25.0000 mg | ORAL_TABLET | Freq: Every day | ORAL | 6 refills | Status: DC

## 2018-11-03 MED ORDER — INSULIN ASPART PROT & ASPART (70-30 MIX) 100 UNIT/ML ~~LOC~~ SUSP: 6.0000 [IU] | Freq: Two times a day (BID) | SUBCUTANEOUS | 11 refills | Status: DC

## 2018-11-03 MED ORDER — INSULIN ASPART 100 UNIT/ML ~~LOC~~ SOLN: 0.0000 [IU] | Freq: Three times a day (TID) | SUBCUTANEOUS | 11 refills | Status: DC

## 2018-11-03 MED FILL — ATORVASTATIN CALCIUM 80 MG: 80 | 30 days supply | Qty: 30 | Fill #0

## 2018-11-03 MED FILL — BRILINTA 90 MG TABLET: 90 | 30 days supply | Qty: 60 | Fill #0

## 2018-11-03 MED FILL — ASPIRIN LOW DOSE 81 MG TBEC: 81 | 90 days supply | Qty: 90 | Fill #0

## 2018-11-03 MED FILL — METOPROLOL SUCCINATE ER 25: 25 | 30 days supply | Qty: 30 | Fill #0

## 2018-11-03 MED FILL — NITROGLYCERIN 0.4 MG TAB SL: 0.4 | 7 days supply | Qty: 25 | Fill #0

## 2018-11-03 NOTE — Progress Notes (Signed)
Note plans for d/c.   Sent page to MD.  Patient should not need Novolog correction at home.  Also please consider changing Novolog 70/30 to Novolin 70/30 (Reli-on) since it can be purchased for 25$ at [facility].  Patient was instructed on insulin administration with son by Diabetes Coordinator on 11/01/18.    Thanks  Beryl Meager, RN, BC-ADM Inpatient Diabetes Coordinator Pager [phone] (8a-5p)

## 2018-11-03 NOTE — Progress Notes (Signed)
CARDIAC REHAB PHASE I   PRE:  Rate/Rhythm: 74 SR  BP:  Supine: 109/67  Sitting:   Standing:    SaO2: 92%RA  MODE:  Ambulation: 670 ft   POST:  Rate/Rhythm: 96 SR  BP:  Supine: 127/75  Sitting:   Standing:    SaO2: 96%RA 1010-1030 Pt walked 670 ft on RA with steady gait and tolerated well. Pt smiling. Back to bed after walk.   Luetta Nuttingharlene Yevette Knust, RN BSN  11/03/2018 10:25 AM

## 2018-11-03 NOTE — Care Management (Addendum)
1422 11-03-18 Tomi Bamberger, RN, BSN [phone] CM will not be able to [facility] the patient at this time. Patient used [facility] in April. CM spoke with Transitions of Care Pharmacist and we will have to work with family to assist with cost. Patient can utilize the [facility] in the future for assistance with medications. No further needs from CM at this time.

## 2018-11-03 NOTE — Discharge Summary (Addendum)
Discharge Summary    Patient ID: Stephanie Cooke MRN: [id number]; DOB: 01/27/1961  Admit date: 10/30/2018 Discharge date: 11/03/2018  Primary Care Provider: Julieanne MansonMulberry, Elizabeth, MD  Primary Cardiologist: Peter SwazilandJordan, MD   Discharge Diagnoses    Principal Problem:   STEMI involving oth coronary artery of inferior wall [facility](HCC) Active Problems:   New onset type 2 diabetes mellitus (HCC)   HTN (hypertension)   Hyperlipidemia LDL goal <70   STEMI involving left circumflex coronary artery (HCC)   STEMI (ST elevation myocardial infarction) (HCC)   Allergies No Known Allergies  Diagnostic Studies/Procedures    TRANSTHORACIC ECHOCARDIOGRAM 10/31/18 IMPRESSIONS  1. The left ventricle appears to be normal in size, has normal wall thickness 60-65% ejection fraction Spectral Doppler shows indeterminate pattern of diastolic filling.  2. Right ventricular systolic pressure is could not be assessed.  3. The right ventricle has normal size and normal systolic function.  4. Normal left atrial size.  5. Normal right atrial size.  6. Mitral valve regurgitation is trivial by color flow Doppler.  7. The mitral valve normal in structure and function.  8. Normal tricuspid valve.  9. Aortic valve normal. 10. No atrial level shunt detected by color flow Doppler  Coronary/Graft Acute MI Revascularization  10/30/18  CORONARY STENT INTERVENTION  LEFT HEART CATH AND CORONARY ANGIOGRAPHY  Conclusion     2nd Mrg lesion is 99% stenosed.  Ost 1st Mrg to 1st Mrg lesion is 85% stenosed.  Prox RCA lesion is 70% stenosed.  Post intervention, there is a 0% residual stenosis.  A drug-eluting stent was successfully placed using a STENT SYNERGY DES 2.25X16.  Post intervention, there is a 0% residual stenosis.  A drug-eluting stent was successfully placed using a STENT SYNERGY DES 2.25X16.  The left ventricular systolic function is normal.  LV end diastolic pressure is normal.  The left ventricular  ejection fraction is 55-65% by visual estimate.   1. 3 vessel obstructive CAD.    - 85% first OM    -99% second OM. This is the culprit lesion    - 70% proximal RCA 2. Normal LV function 3. Normal LVEDP 4. Successful PCI of the second OM with DES x 1 5. Successful PCI of the first OM with DES x 1.   Plan; DAPT x 1 year. I would treat residual disease in the RCA initially with medical therapy. If she has refractory chest pain this could be treated with PCI. Anticipate fast track DC.    Diagnostic  Dominance: Right    Intervention       History of Present Illness     58 y.o. [place] female w/ hx HTN, HLD, DM, was admitted 01/27 w/ inferior STEMI.  She speaks [place] Arabic.  No prior history of any cardiac issues. She had chest pain at approximately 8 AM on day of presentation.  She had another episode about noon, and another episode at approximately 3:30 PM.  The last episode was the most severe.  It was associated with shortness of breath, nausea and vomiting.  Her son brought her to the hospital.  In the emergency room, her ECG was consistent with an inferior STEMI.  She was given aspirin and a heparin bolus.  IV nitroglycerin drip was started.  She was taken emergently to the Cath Lab.   Hospital Course     Consultants: None  1. STEMI involving both coronary artery of inferior wall: -Cath showed 3 V disease. S/p successful PCI of the second OM with  DES x 1 and PCI of the first OM with DES x 1. Plan to treat residual 70% pRCA medically.  ContinueASA and Brilinta (although may need to convert to Plavix if $ issues - PCP may be able to help with OP sourcing) -Continue low dose Beta Blocker. Echo with stable EF (suggests that physiologically more c/w NSTEMI)  2.Type 2 diabetes mellitus: -HbA1c, 11.0>>> ongoing issue. DM coordinator help appreciated. Now on insuline as below.  - Interpreter used for education.   3. HTN: -BP stable on low dose BB. Will resume home  ACE.   4. Hyperlipidemia: -10/30/2018: Cholesterol 227; HDL 50; LDL Cholesterol 146; Triglycerides 153; VLDL 31 -Continue high dose statin, recheck lipids and liver functions in 6 weeks   Discharge Vitals Blood pressure 107/87, pulse 70, temperature 98.3 F (36.8 C), temperature source Oral, resp. rate 17, height 4\' 11"  (1.499 m), weight 56.8 kg, SpO2 94 %.  Filed Weights   11/01/18 0530 11/02/18 0415 11/03/18 0347  Weight: 56.6 kg 55.8 kg 56.8 kg   Physical Exam  Constitutional: She is oriented to person, place, and time and well-developed, well-nourished, and in no distress.  HENT:  Head: Normocephalic and atraumatic.  Eyes: Pupils are equal, round, and reactive to light. Conjunctivae and EOM are normal.  Neck: Normal range of motion. Neck supple.  Cardiovascular: Normal rate and regular rhythm.  Right radial cath site without hematoma  Pulmonary/Chest: Effort normal and breath sounds normal.  Abdominal: Soft. Bowel sounds are normal.  Musculoskeletal: Normal range of motion.  Neurological: She is alert and oriented to person, place, and time.  Skin: Skin is warm and dry.  Psychiatric: Affect normal.    Labs & Radiologic Studies    CBC No results for input(s): WBC, NEUTROABS, HGB, HCT, MCV, PLT in the last 72 hours. Basic Metabolic Panel Recent Labs    16/07/9600/29/20 0331 11/02/18 0106  NA 138 137  K 3.6 3.7  CL 103 106  CO2 25 23  GLUCOSE 198* 200*  BUN 5* 8  CREATININE 0.53 0.64  CALCIUM 9.3 9.0   _____________  Dg Chest Port 1 View  Result Date: 10/30/2018 CLINICAL DATA:  Chest pain. EXAM: PORTABLE CHEST 1 VIEW COMPARISON:  None. FINDINGS: The heart size and pulmonary vascularity are normal. No infiltrates or effusions. No acute bone abnormality. IMPRESSION: No acute abnormalities. Electronically Signed   By: Francene BoyersJames  Maxwell M.D.   On: 10/30/2018 16:57   Disposition   Pt is being discharged home today in good condition.  Follow-up Plans & Appointments     Follow-up Information    [facility]. Go on 11/06/2018.   Specialty:  Family Medicine Why:  at 2:30 pm for your follow-up appointment; Please bring your son during the appointment to provide translation.  Contact information: [address] [place] [phone]       Jodelle GrossLawrence, Kathryn M, NP. Go on 11/14/2018.   Specialties:  Nurse Practitioner, Radiology, Cardiology Why:  @8am  for hospital follow up. Please arrive 10 minutes early  Contact information: [address] [place] [id number] [phone]        [facility] Follow up.   Why:  this location for assistance with medications in the near future. Medications will range from $4.00-$10.00 Contact information: [address] [facility] [place] [phone]         Discharge Instructions    Amb Referral to Cardiac Rehabilitation   Complete by:  As directed    Diagnosis:   STEMI Coronary Stents     Diet - low sodium heart healthy   Complete by:  As directed    Discharge instructions   Complete by:  As directed    No driving for 2 weeks. No lifting over 10 lbs for 4 weeks. No sexual activity for 4 weeks. You may not return to work until cleared by your cardiologist. Keep procedure site clean & dry. If you notice increased pain, swelling, bleeding or pus, call/return!  You may shower, but no soaking baths/hot tubs/pools for 1 week.   Increase activity slowly   Complete by:  As directed       Discharge Medications   Allergies as of 11/03/2018   No Known Allergies     Medication List    TAKE these medications   aspirin 81 MG EC tablet Take 1 tablet (81 mg total) by mouth daily. Start taking on:  November 04, 2018   atorvastatin 80 MG tablet Commonly known as:  LIPITOR Take 1 tablet (80 mg total) by mouth daily at 6 PM.   insulin NPH-regular Human (70-30) 100 UNIT/ML injection Commonly known  as:  NOVOLIN 70/30 Inject 0.06 mLs (6 Units total) into the skin 2 (two) times daily with a meal.   lisinopril 5 MG tablet Commonly known as:  PRINIVIL,ZESTRIL Take 1 tablet (5 mg total) by mouth daily.   metFORMIN 500 MG 24 hr tablet Commonly known as:  GLUCOPHAGE-XR 1 tab by mouth twice daily with meals What changed:    how much to take  how to take this  when to take this   metoprolol succinate 25 MG 24 hr tablet Commonly known as:  TOPROL-XL Take 1 tablet (25 mg total) by mouth daily. Start taking on:  November 04, 2018   nitroGLYCERIN 0.4 MG SL tablet Commonly known as:  NITROSTAT Place 1 tablet (0.4 mg total) under the tongue every 5 (five) minutes x 3 doses as needed for chest pain.   ticagrelor 90 MG Tabs tablet Commonly known as:  BRILINTA Take 1 tablet (90 mg total) by mouth 2 (two) times daily.        Acute coronary syndrome (MI, NSTEMI, STEMI, etc) this admission?: Yes.     AHA/ACC Clinical Performance & Quality Measures: 1. Aspirin prescribed? - Yes 2. ADP Receptor Inhibitor (Plavix/Clopidogrel, Brilinta/Ticagrelor or Effient/Prasugrel) prescribed (includes medically managed patients)? - Yes 3. Beta Blocker prescribed? - Yes 4. High Intensity Statin (Lipitor 40-80mg  or Crestor 20-40mg ) prescribed? - Yes 5. EF assessed during THIS hospitalization? - Yes 6. For EF <40%, was ACEI/ARB prescribed? - Not Applicable (EF >/= 40%) 7. For EF <40%, Aldosterone Antagonist (Spironolactone or Eplerenone) prescribed? - Not Applicable (EF >/= 40%) 8. Cardiac Rehab Phase II ordered (Included Medically managed Patients)? - Yes     Outstanding Labs/Studies   Consider OP f/u labs 6-8 weeks given statin initiation this admission.  Duration of Discharge Encounter   Greater than 30 minutes including physician time.  SignedSharrell Ku Greenfield, PA 11/03/2018, 3:25 PM  ATTENDING ATTESTATION  I have seen, examined and evaluated the patient this PM along with Manson Passey, PA.  After reviewing all the available data and chart, we discussed the patients laboratory, study & physical findings as well as symptoms in detail. I agree with his findings, examination as well as impression & d/c summary as per our discussion.    She has been remained stable from a cardiac standpoint, the last day  or 2 is been dedicated to establishing a stable diabetes management plan in a patient with a STEMI and A1c of 11.  Most limitations have been based on her language barrier.  We have now had a language interpreter present during education.  We have also had the pharmacist present during this discussion.  She has stable and ready to go home  Agree with discharge summary.    Bryan Lemma, M.D., M.S. Interventional Cardiologist   Pager # [phone] Phone # [phone] [address] [place], [place] [address]     Bryan Lemma, M.D., M.S. Interventional Cardiologist   Pager # [phone] Phone # [phone] [address] [place], [place] [address]

## 2018-11-06 ENCOUNTER — Ambulatory Visit: Payer: Self-pay | Admitting: Internal Medicine

## 2018-11-07 ENCOUNTER — Ambulatory Visit: Payer: Self-pay | Admitting: Internal Medicine

## 2018-11-07 ENCOUNTER — Encounter: Payer: Self-pay | Admitting: Internal Medicine

## 2018-11-07 VITALS — BP 122/78 | HR 78 | Resp 12 | Ht <= 58 in | Wt 125.0 lb

## 2018-11-07 DIAGNOSIS — I10 Essential (primary) hypertension: Secondary | ICD-10-CM

## 2018-11-07 DIAGNOSIS — I2119 ST elevation (STEMI) myocardial infarction involving other coronary artery of inferior wall: Secondary | ICD-10-CM

## 2018-11-07 DIAGNOSIS — E782 Mixed hyperlipidemia: Secondary | ICD-10-CM

## 2018-11-07 DIAGNOSIS — E118 Type 2 diabetes mellitus with unspecified complications: Secondary | ICD-10-CM

## 2018-11-07 LAB — GLUCOSE, POCT (MANUAL RESULT ENTRY): POC GLUCOSE: 238 mg/dL — AB (ref 70–99)

## 2018-11-07 MED ORDER — GLIPIZIDE 5 MG PO TABS: ORAL_TABLET | ORAL | 11 refills | Status: DC

## 2018-11-07 MED ORDER — GLUCOSE BLOOD VI STRP: ORAL_STRIP | 11 refills | Status: DC

## 2018-11-07 MED ORDER — AGAMATRIX PRESTO W/DEVICE KIT: PACK | 0 refills | Status: DC

## 2018-11-07 MED ORDER — ATORVASTATIN CALCIUM 80 MG PO TABS: ORAL_TABLET | ORAL | 11 refills | Status: DC

## 2018-11-07 MED ORDER — LISINOPRIL 5 MG PO TABS: 5.0000 mg | ORAL_TABLET | Freq: Every day | ORAL | 11 refills | Status: DC

## 2018-11-07 MED ORDER — METFORMIN HCL ER 500 MG PO TB24: ORAL_TABLET | ORAL | 11 refills | Status: DC

## 2018-11-07 MED ORDER — AGAMATRIX ULTRA-THIN LANCETS MISC: 11 refills | Status: DC

## 2018-11-07 MED ORDER — METOPROLOL TARTRATE 25 MG PO TABS: ORAL_TABLET | ORAL | 11 refills | Status: DC

## 2018-11-07 MED ORDER — TICAGRELOR 90 MG PO TABS: 90.0000 mg | ORAL_TABLET | Freq: Two times a day (BID) | ORAL | 11 refills | Status: DC

## 2018-11-07 NOTE — Patient Instructions (Signed)

## 2018-11-07 NOTE — Progress Notes (Signed)
Subjective:    Patient ID: Stephanie Cooke, female   DOB: 05/30/1961, 58 y.o.   MRN: [id number]   HPI   1.  STEMI on 10/30/2018:  3 vessel disease on cath.  PCI with DES x 2 placed with 0% residual stenosis of first OM and second OM.   70% stenosis of RCA elected to treat medically for now.   Discussed at length today importance of control of risk factors, including DM, Hypertension, hypercholesterolemia. Son describes patient has had some denial issues in past and stops taking her meds and following up. Waiting to hear about cardiac rehab. Lot of cultural concerns about eating a good diet when her friends bring her food not on a healthy menu.    2.  Hypertension:  Controlled today with medication.    3.  DM:  A1C was 11.0 on 10/30/2018.  Had one appt with patient in April of 2019 and did not follow up.  Admits to filling her meds only once during the approximately 9 months span before her MI.   She was taking Metformin ER 500 mg twice daily as well as Amaryl 4 mg daily before her hospitalization.  Family is finding the insulin injections onerous. Kidney function normal in hospital.  4.  Hyperlipidemia:   Now taking Atorvastatin 80 mg daily Lipid Panel     Component Value Date/Time   CHOL 227 (H) 10/30/2018 1635   TRIG 153 (H) 10/30/2018 1635   HDL 50 10/30/2018 1635   CHOLHDL 4.5 10/30/2018 1635   VLDL 31 10/30/2018 1635   LDLCALC 146 (H) 10/30/2018 1635  :    Current Meds  Medication Sig  . aspirin EC 81 MG EC tablet Take 1 tablet (81 mg total) by mouth daily.  Marland Kitchen. atorvastatin (LIPITOR) 80 MG tablet Take 1 tablet (80 mg total) by mouth daily at 6 PM.  . insulin NPH-regular Human (NOVOLIN 70/30) (70-30) 100 UNIT/ML injection Inject 0.06 mLs (6 Units total) into the skin 2 (two) times daily with a meal.  . lisinopril (PRINIVIL,ZESTRIL) 5 MG tablet Take 1 tablet (5 mg total) by mouth daily.  . metFORMIN (GLUCOPHAGE-XR) 500 MG 24 hr tablet 1 tab by mouth twice daily with meals  (Patient taking differently: Take 500 mg by mouth 2 (two) times daily with a meal. 1 tab by mouth twice daily with meals)  . metoprolol succinate (TOPROL-XL) 25 MG 24 hr tablet Take 1 tablet (25 mg total) by mouth daily.  . nitroGLYCERIN (NITROSTAT) 0.4 MG SL tablet Place 1 tablet (0.4 mg total) under the tongue every 5 (five) minutes x 3 doses as needed for chest pain.  . ticagrelor (BRILINTA) 90 MG TABS tablet Take 1 tablet (90 mg total) by mouth 2 (two) times daily.   No Known Allergies   Review of Systems   No chest pain, PND, Orthopnea, dyspnea or LE edema.    Objective:   BP 122/78 (BP Location: Left Arm, Patient Position: Sitting, Cuff Size: Normal)   Pulse 78   Resp 12   Ht 4' 9.5" (1.461 m)   Wt 125 lb (56.7 kg)   LMP  (LMP Unknown)   BMI 26.58 kg/m   Physical Exam   NAD HEENT:  PERRL, EOMI Neck:  No JVD Lungs:  CTA CV:  RRR without murmur or rub.  Radial and DP pulses normal and equal Abd:  S, NT, No HSM or mass, + BS LE:  No edema   Assessment & Plan   1.  STEMI with PCA and stents x 2, RCA with 70% stenosis:  Control risk factors for recurrence. Brilinta Rx [facility] to [facility] at [facility].  Son understands he needs to get application in ASAP. Continue ASA as well.  2.  DM:  Switch to Glipizide from insulin for ease of use.  Continue Metformin.   Needs orange card for glucometer and test strips/lancets through [facility].  3.  Hyperlipidemia:  Orange card needed for affordable Atorvastatin through [facility] pharm.  4.  Hypertension:  Controlled on Lisinopril and Metoprolol.  Switching to regular release of latter for cost.

## 2018-11-08 ENCOUNTER — Telehealth (HOSPITAL_COMMUNITY): Payer: Self-pay

## 2018-11-08 NOTE — Progress Notes (Signed)
Transitions of Care Follow Up Call Note  Tauri Fandrich is an 58 y.o. female who presented to [facility] on 10/30/2018.  The patient had the following prescriptions filled at [facility]: aspirin, atorvastatin, brilinta, metoprolol, nitroglycerin   Pharmacist comments: Patient unable to be reached.    []  Patient's prescriptions filled at the [facility] were transferred to the following pharmacy:  [x]  Patient unable to be reached after calling three times and prescriptions filled at the [facility] were transferred to preferred pharmacy found within their chart. [facility] [address]    Varney Baas Tiziana Cislo 11/08/2018, 5:06 PM Transitions of Care Pharmacy Hours: Monday - Friday 8:30am to 5:00 PM  Phone - [phone]

## 2018-11-08 NOTE — Telephone Encounter (Signed)
Called and spoke with pt son Shirline Frees, who stated pt would not be able to do program due to transportation.  Closed referral

## 2018-11-13 NOTE — Progress Notes (Signed)
Cardiology Office Note   Date:  11/14/2018   ID:  Ivor Messier, DOB 05-03-1961, MRN [id number]  PCP:  Mack Hook, MD  Cardiologist:  Dr.Jordan  Chief Complaint  Patient presents with  . Coronary Artery Disease  . Hospitalization Follow-up     History of Present Illness: Stephanie Cooke is a 58 y.o. female who presents for ongoing assessment and management of CAD, HTN, HL with Type II diabetes, we are seeing after recent admission for inferior STEMI. She speak [place] Arabic.   She presented to the ED with chest pain with associated nausea and vomiting, and dyspnea. ECG confirmed inferior STEMI. She was taken emergently to the cath lab with angiography revealing 3 vessel disease. She had successful PCI of the 2nd OM with DES X 1, and PCI of the first OM with DES X1, with 70% prox RCA planned to treat medically. She was started on Brilinta and ASA, Metoprolol 25 mg , lisinopril 5 mg, and atorvastatin 80 mg daily.   She was identified as diabetic by Hgb A1c of 11.0 and was started on insulin and metformin. LDL was 146. She was to have labs rechecked in 6 weeks. She was discharged on 11/03/2018. She is here with an interpretor as she does not speak [place].   She does not have any new complaints, She is tolerating her medications and is compliant. No reported bleeding or bruising.  She has gone back to her normal activities.    Past Medical History:  Diagnosis Date  . Diabetes mellitus without complication ([place]) 56/21/3086  . HTN (hypertension) 10/30/2018  . Hyperlipidemia LDL goal <70 10/30/2018  . STEMI involving oth coronary artery of inferior wall ([place]) 10/30/2018    Past Surgical History:  Procedure Laterality Date  . CHOLECYSTECTOMY N/A 01/05/2018   Procedure: LAPAROSCOPIC CHOLECYSTECTOMY;  Surgeon: Rolm Bookbinder, MD;  Location: [facility];  Service: General;  Laterality: N/A;  . CORONARY STENT INTERVENTION N/A 10/30/2018   Procedure: CORONARY STENT INTERVENTION;  Surgeon: Martinique,  Peter M, MD;  Location: [place] [facility] CV LAB;  Service: Cardiovascular;  Laterality: N/A;  . CORONARY/GRAFT ACUTE MI REVASCULARIZATION N/A 10/30/2018   Procedure: Coronary/Graft Acute MI Revascularization;  Surgeon: Martinique, Peter M, MD;  Location: [facility];  Service: Cardiovascular;  Laterality: N/A;  . LEFT HEART CATH AND CORONARY ANGIOGRAPHY N/A 10/30/2018   Procedure: LEFT HEART CATH AND CORONARY ANGIOGRAPHY;  Surgeon: Martinique, Peter M, MD;  Location: [place] CV [facility];  Service: Cardiovascular;  Laterality: N/A;     Current Outpatient Medications  Medication Sig Dispense Refill  . AGAMATRIX ULTRA-THIN LANCETS MISC Check blood glucose twice daily before meals 60 each 11  . aspirin EC 81 MG EC tablet Take 1 tablet (81 mg total) by mouth daily. 90 tablet 3  . atorvastatin (LIPITOR) 80 MG tablet 1 tab by mouth daily with evening meal 30 tablet 11  . Blood Glucose Monitoring Suppl (AGAMATRIX PRESTO) w/Device KIT Check blood glucose twice daily before meals. 1 kit 0  . glipiZIDE (GLUCOTROL) 5 MG tablet 1 tab by mouth twice daily with a meal 60 tablet 11  . glucose blood (AGAMATRIX PRESTO TEST) test strip Check blood glucose twice daily before meals 60 each 11  . insulin NPH-regular Human (NOVOLIN 70/30) (70-30) 100 UNIT/ML injection Inject 0.06 mLs (6 Units total) into the skin 2 (two) times daily with a meal. 10 mL 3  . lisinopril (PRINIVIL,ZESTRIL) 5 MG tablet Take 1 tablet (5 mg total) by mouth daily. 30 tablet 11  .  metFORMIN (GLUCOPHAGE-XR) 500 MG 24 hr tablet 1 tab by mouth twice daily with meals 60 tablet 11  . metoprolol succinate (TOPROL-XL) 25 MG 24 hr tablet Take 1 tablet (25 mg total) by mouth daily. 30 tablet 6  . metoprolol tartrate (LOPRESSOR) 25 MG tablet 1/2 tab by mouth twice daily 30 tablet 11  . nitroGLYCERIN (NITROSTAT) 0.4 MG SL tablet Place 1 tablet (0.4 mg total) under the tongue every 5 (five) minutes x 3 doses as needed for chest pain. 25 tablet 12  . ticagrelor  (BRILINTA) 90 MG TABS tablet Take 1 tablet (90 mg total) by mouth 2 (two) times daily. 60 tablet 11   No current facility-administered medications for this visit.     Allergies:   Patient has no known allergies.    Social History:  The patient  reports that she has quit smoking. She has never used smokeless tobacco. She reports previous alcohol use. She reports previous drug use.   Family History:  The patient's family history includes Hypertension in her maternal aunt.    ROS: All other systems are reviewed and negative. Unless otherwise mentioned in H&P    PHYSICAL EXAM: VS:  BP (!) 146/82   Pulse 83   Ht 4' 9.5" (1.461 m)   Wt 126 lb 8 oz (57.4 kg)   LMP  (LMP Unknown)   SpO2 97%   BMI 26.90 kg/m  , BMI Body mass index is 26.9 kg/m. GEN: Well nourished, well developed, in no acute distress HEENT: normal Neck: no JVD, carotid bruits, or masses Cardiac: RRR; no murmurs, rubs, or gallops,no edema  Respiratory:  Clear to auscultation bilaterally, normal work of breathing GI: soft, nontender, nondistended, + BS MS: no deformity or atrophy.Cath insertion site is well healed  Skin: warm and dry, no rash Neuro:  Strength and sensation are intact Psych: euthymic mood, full affect   EKG:  NSR. Peaked T-wave V1 and V2 Recent Labs: 10/31/2018: ALT 28; Hemoglobin 13.8; Magnesium 1.9; Platelets 231 11/02/2018: BUN 8; Creatinine, Ser 0.64; Potassium 3.7; Sodium 137    Lipid Panel    Component Value Date/Time   CHOL 227 (H) 10/30/2018 1635   TRIG 153 (H) 10/30/2018 1635   HDL 50 10/30/2018 1635   CHOLHDL 4.5 10/30/2018 1635   VLDL 31 10/30/2018 1635   LDLCALC 146 (H) 10/30/2018 1635      Wt Readings from Last 3 Encounters:  11/14/18 126 lb 8 oz (57.4 kg)  11/07/18 125 lb (56.7 kg)  11/03/18 125 lb 4.8 oz (56.8 kg)      Other studies Reviewed: TRANSTHORACIC ECHOCARDIOGRAM 10/31/18 IMPRESSIONS 1. The left ventricle appears to be normal in size, has normal wall  thickness 60-65% ejection fraction Spectral Doppler shows indeterminate pattern of diastolic filling. 2. Right ventricular systolic pressure is could not be assessed. 3. The right ventricle has normal size and normal systolic function. 4. Normal left atrial size. 5. Normal right atrial size. 6. Mitral valve regurgitation is trivial by color flow Doppler. 7. The mitral valve normal in structure and function. 8. Normal tricuspid valve. 9. Aortic valve normal. 10. No atrial level shunt detected by color flow Doppler  Coronary/Graft Acute MI Revascularization  10/30/18  CORONARY STENT INTERVENTION  LEFT HEART CATH AND CORONARY ANGIOGRAPHY  Conclusion     2nd Mrg lesion is 99% stenosed.  Ost 1st Mrg to 1st Mrg lesion is 85% stenosed.  Prox RCA lesion is 70% stenosed.  Post intervention, there is a 0% residual stenosis.  A drug-eluting stent was successfully placed using a STENT SYNERGY DES 2.25X16.  Post intervention, there is a 0% residual stenosis.  A drug-eluting stent was successfully placed using a STENT SYNERGY DES 2.25X16.  The left ventricular systolic function is normal.  LV end diastolic pressure is normal.  The left ventricular ejection fraction is 55-65% by visual estimate.  1. 3 vessel obstructive CAD. - 85% first OM -99% second OM. This is the culprit lesion - 70% proximal RCA 2. Normal LV function 3. Normal LVEDP 4. Successful PCI of the second OM with DES x 1 5. Successful PCI of the first OM with DES x 1.   Plan; DAPT x 1 year. I would treat residual disease in the RCA initially with medical therapy. If she has refractory chest pain this could be treated with PCI. Anticipate fast track DC.      ASSESSMENT AND PLAN:  1. CAD: S/P inferior MI s/p DES to the 1st OM and 2nd OM. She has a 70% proximal RCA which is being treated medically. She has not complaints at this time. Cath and stent placement were reviewed with the patient and a  copy of the illustration is provided.    2. Hypercholesterolemia: She is now on high dose statin. She will have fasting lipids and LFT's in one month. Goal of LDL < 70.   3. Hypertension: Slightly elevated today. Will follow. In diabetic cardiac patients would like to keep BP 505'L systolic.   4. Newly Diagnosed Diabetes: She is being followed by PCP.    Current medicines are reviewed at length with the patient today.    Labs/ tests ordered today include: Fasting lipids and LFT's.BMET.   Phill Myron. West Pugh, ANP, [facility]   11/14/2018 8:31 AM    [facility] [address] Office [phone] Fax [phone]

## 2018-11-14 ENCOUNTER — Ambulatory Visit (INDEPENDENT_AMBULATORY_CARE_PROVIDER_SITE_OTHER): Payer: Self-pay | Admitting: Adult Health

## 2018-11-14 ENCOUNTER — Encounter: Payer: Self-pay | Admitting: Adult Health

## 2018-11-14 VITALS — BP 146/82 | HR 83 | Ht <= 58 in | Wt 126.5 lb

## 2018-11-14 DIAGNOSIS — E7800 Pure hypercholesterolemia, unspecified: Secondary | ICD-10-CM

## 2018-11-14 DIAGNOSIS — Z9889 Other specified postprocedural states: Secondary | ICD-10-CM

## 2018-11-14 DIAGNOSIS — Z79899 Other long term (current) drug therapy: Secondary | ICD-10-CM

## 2018-11-14 DIAGNOSIS — I2510 Atherosclerotic heart disease of native coronary artery without angina pectoris: Secondary | ICD-10-CM

## 2018-11-14 DIAGNOSIS — I10 Essential (primary) hypertension: Secondary | ICD-10-CM

## 2018-11-14 NOTE — Patient Instructions (Signed)
Medication Instructions:  NO CHANGES- Your physician recommends that you continue on your current medications as directed. Please refer to the Current Medication list given to you today. If you need a refill on your cardiac medications before your next appointment, please call your pharmacy.  Labwork: BMET,LFT AND FASTING LIPIDS-IN 1 MONTH HERE IN OUR OFFICE AT [facility]   You will need to fast. DO NOT EAT OR DRINK PAST MIDNIGHT.     Take the provided lab slips with you to the lab for your blood draw.   When you have your labs (blood work) drawn today and your tests are completely normal, you will receive your results only by MyChart Message (if you have MyChart) -OR-  A paper copy in the mail.  If you have any lab test that is abnormal or we need to change your treatment, we will call you to review these results.  Special Instructions: MAKE SURE TO CALL IF YOU HAVE ANY ISSUES WITH BRILINTA   Follow-Up: You will need a follow up appointment in 3 months.  You may see Peter [place], MD Joni Reining, DNP, AACC  or one of the following Advanced Practice Providers on your designated Care Team:  Azalee Course, PA-C  ngela Duke, [place]   At [facility], you and your health needs are our priority.  As part of our continuing mission to provide you with exceptional heart care, we have created designated Provider Care Teams.  These Care Teams include your primary Cardiologist (physician) and Advanced Practice Providers (APPs -  Physician Assistants and Nurse Practitioners) who all work together to provide you with the care you need, when you need it.  Thank you for choosing [facility] [place]!!

## 2018-12-06 ENCOUNTER — Encounter (HOSPITAL_COMMUNITY): Payer: Self-pay | Admitting: Emergency Medicine

## 2018-12-06 ENCOUNTER — Ambulatory Visit (HOSPITAL_COMMUNITY)
Admission: EM | Admit: 2018-12-06 | Discharge: 2018-12-06 | Disposition: A | Discharge status: Home / Self Care | Payer: Self-pay | Attending: Family Medicine | Admitting: Family Medicine

## 2018-12-06 DIAGNOSIS — H60391 Other infective otitis externa, right ear: Secondary | ICD-10-CM

## 2018-12-06 DIAGNOSIS — H66001 Acute suppurative otitis media without spontaneous rupture of ear drum, right ear: Secondary | ICD-10-CM

## 2018-12-06 MED ORDER — AMOXICILLIN 500 MG PO CAPS: 500.0000 mg | ORAL_CAPSULE | Freq: Two times a day (BID) | ORAL | 0 refills | Status: AC

## 2018-12-06 NOTE — ED Triage Notes (Signed)
Pt c/o ear pain and fever this morning, with chills and headache. C/o R ear pain since this morning.

## 2018-12-06 NOTE — Discharge Instructions (Signed)
Get plenty of rest and push fluids Amoxicillin prescribed for external and possible internal ear infection.  Take medication as prescribed and to completion Use OTC medications like ibuprofen or tylenol as needed fever or pain Follow up with PCP next week for recheck and to ensure your symptoms are improving Return or go to ER if you have any new or worsening symptoms fever, chills, nausea, vomiting, chest pain, cough, shortness of breath, wheezing, abdominal pain, changes in bowel or bladder habits, etc..Marland Kitchen

## 2018-12-06 NOTE — ED Provider Notes (Signed)
[facility]   [id number] 12/06/18 Arrival Time: 1006   CC: Ear pain  SUBJECTIVE: History from: family.  Stephanie Cooke is a 58 y.o. female DM, HTN, HLD, STEMI, who presents with right ear pain x 5 days.  Denies known sick exposure or precipitating event.  Describes as intermittent and sharp.  Has tried OTC tylenol with relief.  Denies previous symptoms in the past.   Denies fever, chills, fatigue, sinus pain, rhinorrhea, sore throat, cough, SOB, wheezing, chest pain, nausea, changes in bowel or bladder habits.    ROS: As per HPI.  Past Medical History:  Diagnosis Date  . Diabetes mellitus without complication (HCC) 01/04/2018  . HTN (hypertension) 10/30/2018  . Hyperlipidemia LDL goal <70 10/30/2018  . STEMI involving oth coronary artery of inferior wall (HCC) 10/30/2018   Past Surgical History:  Procedure Laterality Date  . CHOLECYSTECTOMY N/A 01/05/2018   Procedure: LAPAROSCOPIC CHOLECYSTECTOMY;  Surgeon: Wakefield, Matthew, MD;  Location: [facility];  Service: General;  Laterality: N/A;  . CORONARY STENT INTERVENTION N/A 10/30/2018   Procedure: CORONARY STENT INTERVENTION;  Surgeon: Jordan, Peter M, MD;  Location: [facility];  Service: Cardiovascular;  Laterality: N/A;  . CORONARY/GRAFT ACUTE MI REVASCULARIZATION N/A 10/30/2018   Procedure: Coronary/Graft Acute MI Revascularization;  Surgeon: Jordan, Peter M, MD;  Location: [facility];  Service: Cardiovascular;  Laterality: N/A;  . LEFT HEART CATH AND CORONARY ANGIOGRAPHY N/A 10/30/2018   Procedure: LEFT HEART CATH AND CORONARY ANGIOGRAPHY;  Surgeon: Jordan, Peter M, MD;  Location: [facility];  Service: Cardiovascular;  Laterality: N/A;   No Known Allergies No current facility-administered medications on file prior to encounter.    Current Outpatient Medications on File Prior to Encounter  Medication Sig Dispense Refill  . AGAMATRIX ULTRA-THIN LANCETS MISC Check blood glucose twice daily before meals 60  each 11  . aspirin EC 81 MG EC tablet Take 1 tablet (81 mg total) by mouth daily. 90 tablet 3  . atorvastatin (LIPITOR) 80 MG tablet 1 tab by mouth daily with evening meal 30 tablet 11  . Blood Glucose Monitoring Suppl (AGAMATRIX PRESTO) w/Device KIT Check blood glucose twice daily before meals. 1 kit 0  . glipiZIDE (GLUCOTROL) 5 MG tablet 1 tab by mouth twice daily with a meal 60 tablet 11  . glucose blood (AGAMATRIX PRESTO TEST) test strip Check blood glucose twice daily before meals 60 each 11  . insulin NPH-regular Human (NOVOLIN 70/30) (70-30) 100 UNIT/ML injection Inject 0.06 mLs (6 Units total) into the skin 2 (two) times daily with a meal. 10 mL 3  . lisinopril (PRINIVIL,ZESTRIL) 5 MG tablet Take 1 tablet (5 mg total) by mouth daily. 30 tablet 11  . metFORMIN (GLUCOPHAGE-XR) 500 MG 24 hr tablet 1 tab by mouth twice daily with meals 60 tablet 11  . metoprolol succinate (TOPROL-XL) 25 MG 24 hr tablet Take 1 tablet (25 mg total) by mouth daily. 30 tablet 6  . metoprolol tartrate (LOPRESSOR) 25 MG tablet 1/2 tab by mouth twice daily 30 tablet 11  . nitroGLYCERIN (NITROSTAT) 0.4 MG SL tablet Place 1 tablet (0.4 mg total) under the tongue every 5 (five) minutes x 3 doses as needed for chest pain. 25 tablet 12  . ticagrelor (BRILINTA) 90 MG TABS tablet Take 1 tablet (90 mg total) by mouth 2 (two) times daily. 60 tablet 11   Social History   Socioeconomic History  . Marital status: Married    Spouse name: Not on file  .   Number of children: Not on file  . Years of education: Not on file  . Highest education level: Not on file  Occupational History  . Not on file  Social Needs  . Financial resource strain: Not on file  . Food insecurity:    Worry: Not on file    Inability: Not on file  . Transportation needs:    Medical: Not on file    Non-medical: Not on file  Tobacco Use  . Smoking status: Former Smoker  . Smokeless tobacco: Never Used  Substance and Sexual Activity  . Alcohol  use: Not Currently  . Drug use: Not Currently  . Sexual activity: Not on file  Lifestyle  . Physical activity:    Days per week: Not on file    Minutes per session: Not on file  . Stress: Not on file  Relationships  . Social connections:    Talks on phone: Not on file    Gets together: Not on file    Attends religious service: Not on file    Active member of club or organization: Not on file    Attends meetings of clubs or organizations: Not on file    Relationship status: Not on file  . Intimate partner violence:    Fear of current or ex partner: Not on file    Emotionally abused: Not on file    Physically abused: Not on file    Forced sexual activity: Not on file  Other Topics Concern  . Not on file  Social History Narrative  . Not on file   Family History  Problem Relation Age of Onset  . Hypertension Maternal Aunt     OBJECTIVE:  Vitals:   12/06/18 1150  BP: 133/85  Pulse: (!) 118  Resp: 20  Temp: 99.1 F (37.3 C)  SpO2: 99%     General appearance: alert; appears fatigued, but nontoxic; speaking in full sentences and tolerating own secretions HEENT: NCAT; Ears: LT EAC clear, TM pearly gray, RT EAC erythematous with cerumen and white exudate, TM not visualized, pain with auricle manipulation; Eyes: PERRL.  EOM grossly intact. Nose: nares patent without rhinorrhea, Throat: oropharynx clear, tonsils non erythematous or enlarged, uvula midline  Neck: supple without LAD Lungs: CTAB Heart: regular rate and rhythm.  Radial pulses 2+ symmetrical bilaterally Skin: warm and dry Psychological: alert and cooperative; normal mood and affect  ASSESSMENT & PLAN:  1. Infective otitis externa of right ear   2. Non-recurrent acute suppurative otitis media of right ear without spontaneous rupture of tympanic membrane     Meds ordered this encounter  Medications  . amoxicillin (AMOXIL) 500 MG capsule    Sig: Take 1 capsule (500 mg total) by mouth 2 (two) times daily for 10  days.    Dispense:  20 capsule    Refill:  0    Order Specific Question:   Supervising Provider    Answer:   NELSON, YVONNE SUE [[id number]]    Get plenty of rest and push fluids Amoxicillin prescribed for external and possible internal ear infection.  Take medication as prescribed and to completion Use OTC medications like ibuprofen or tylenol as needed fever or pain Follow up with PCP next week for recheck and to ensure your symptoms are improving Return or go to ER if you have any new or worsening symptoms fever, chills, nausea, vomiting, chest pain, cough, shortness of breath, wheezing, abdominal pain, changes in bowel or bladder habits, etc...  Reviewed expectations re:   course of current medical issues. Questions answered. Outlined signs and symptoms indicating need for more acute intervention. Patient verbalized understanding. After Visit Summary given.         Wurst, Brittany, PA-C 12/06/18 1439  

## 2018-12-18 ENCOUNTER — Other Ambulatory Visit: Payer: Self-pay

## 2018-12-25 ENCOUNTER — Encounter: Payer: Self-pay | Admitting: Internal Medicine

## 2018-12-25 ENCOUNTER — Ambulatory Visit: Payer: Self-pay | Admitting: Internal Medicine

## 2018-12-25 VITALS — BP 130/80 | HR 72 | Resp 12 | Ht <= 58 in | Wt 123.0 lb

## 2018-12-25 DIAGNOSIS — E118 Type 2 diabetes mellitus with unspecified complications: Secondary | ICD-10-CM

## 2018-12-25 DIAGNOSIS — Z1239 Encounter for other screening for malignant neoplasm of breast: Secondary | ICD-10-CM

## 2018-12-25 DIAGNOSIS — Z9189 Other specified personal risk factors, not elsewhere classified: Secondary | ICD-10-CM

## 2018-12-25 DIAGNOSIS — Z23 Encounter for immunization: Secondary | ICD-10-CM

## 2018-12-25 DIAGNOSIS — Z79899 Other long term (current) drug therapy: Secondary | ICD-10-CM

## 2018-12-25 DIAGNOSIS — E782 Mixed hyperlipidemia: Secondary | ICD-10-CM

## 2018-12-25 NOTE — Progress Notes (Addendum)
Subjective:    Patient ID: Stephanie Cooke, female   DOB: 1961-05-26, 58 y.o.   MRN: [id number]   HPI   1.  DM:  Blood glucose ranging from mid 100s generally to 288.  Some of these are after eating without definitive timing.  She is often not checking sugars twice daily as well. Drinks tea with milk in a.m. before checking sugars. Often does not eat anything until 1 p.m.   Discussed importance of eating, taking meds and checking sugars at regular intervals.   2.  CAD:  BP a bit higher than would like to see.  Continues on Lisinopril 5 mg and Metoprolol. Continues on Brilinta. Has enough samples as await the MAP med.    3.  Hyperlipidemia:  No problems with Atorvastatin  4.  Dental:  Needs routine dental care.    5.  HM:  Did not get pneumovax or Tdap while in hospital it appears.   Due for mammogram as well.  Current Meds  Medication Sig  . AGAMATRIX ULTRA-THIN LANCETS MISC Check blood glucose twice daily before meals  . aspirin EC 81 MG EC tablet Take 1 tablet (81 mg total) by mouth daily.  Marland Kitchen atorvastatin (LIPITOR) 80 MG tablet 1 tab by mouth daily with evening meal  . Blood Glucose Monitoring Suppl (AGAMATRIX PRESTO) w/Device KIT Check blood glucose twice daily before meals.  Marland Kitchen glipiZIDE (GLUCOTROL) 5 MG tablet 1 tab by mouth twice daily with a meal  . glucose blood (AGAMATRIX PRESTO TEST) test strip Check blood glucose twice daily before meals  . lisinopril (PRINIVIL,ZESTRIL) 5 MG tablet Take 1 tablet (5 mg total) by mouth daily.  . metFORMIN (GLUCOPHAGE-XR) 500 MG 24 hr tablet 1 tab by mouth twice daily with meals  . metoprolol succinate (TOPROL-XL) 25 MG 24 hr tablet Take 1 tablet (25 mg total) by mouth daily.  . metoprolol tartrate (LOPRESSOR) 25 MG tablet 1/2 tab by mouth twice daily  . nitroGLYCERIN (NITROSTAT) 0.4 MG SL tablet Place 1 tablet (0.4 mg total) under the tongue every 5 (five) minutes x 3 doses as needed for chest pain.  . ticagrelor (BRILINTA) 90 MG TABS  tablet Take 1 tablet (90 mg total) by mouth 2 (two) times daily.   No Known Allergies   Review of Systems    Objective:   BP 130/80 (BP Location: Left Arm, Patient Position: Sitting, Cuff Size: Normal)   Pulse 72   Resp 12   Ht 4' 9.5" (1.461 m)   Wt 123 lb (55.8 kg)   LMP  (LMP Unknown)   BMI 26.16 kg/m   Physical Exam  NAD HEENT: PERRL EOMI  Neck:  Supple, No adenopathy Chest:  CTA CV:  RRR with normal S1 and S2, No S3, S4 or murmur.  Radial and DP pulses normal and equal LE:  No edema  Diabetic Foot Exam - Simple   Simple Foot Form Diabetic Foot exam was performed with the following findings:  Yes 12/25/2018 10:50 AM  Visual Inspection No deformities, no ulcerations, no other skin breakdown bilaterally:  Yes See comments:  Yes Sensation Testing Intact to touch and monofilament testing bilaterally:  Yes Pulse Check Posterior Tibialis and Dorsalis pulse intact bilaterally:  Yes Comments Feet with vegetable dye, mainly plantar aspect    Assessment & Plan   1.  DM:  Long discussion regarding regular intervals of eating and taking medication.   Needs to be consistent also on a day to day basis.  Needs to be consistent with checking sugars before her breakfast and dinner. She cannot read, but discussed she can still learn to operated her glucometer and copy down the numeric symbols.  Son states he can help her with that. She is willing to work on consistency. A1C, urine microalbumin/crea, CMP Referral for eye exam-discussed delay with COVID 19 currently Pneumococcal 23 v today.  2.  CAD:  No symptoms:  Continue Lisinopril/Metoprolol. Discussed would like to see bp in 120/70 range as well.  3.  Hyperlipidemia:  FLP, CMP  4.  HM:  Tdap, pneumococcal 23 v, mammogram  5.  Dental:  Referral to dental clinic, which will also be delayed.  6.  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 Discussed possible decrease  risk for dementia if always learning new things, particularly if associated with fine motor learning, such as writing.

## 2018-12-26 LAB — COMPREHENSIVE METABOLIC PANEL
ALT: 18 IU/L (ref 0–32)
AST: 17 IU/L (ref 0–40)
Albumin/Globulin Ratio: 1.4 (ref 1.2–2.2)
Albumin: 4.5 g/dL (ref 3.8–4.9)
Alkaline Phosphatase: 150 IU/L — ABNORMAL HIGH (ref 39–117)
BUN/Creatinine Ratio: 21 (ref 12–28)
BUN: 13 mg/dL (ref 8–27)
Bilirubin Total: 0.5 mg/dL (ref 0.0–1.2)
CO2: 22 mmol/L (ref 20–29)
Calcium: 9.6 mg/dL (ref 8.7–10.3)
Chloride: 104 mmol/L (ref 96–106)
Creatinine, Ser: 0.62 mg/dL (ref 0.57–1.00)
GFR calc non Af Amer: 98 mL/min/{1.73_m2} (ref >59)
GFR, EST AFRICAN AMERICAN: 113 mL/min/{1.73_m2} (ref >59)
Globulin, Total: 3.3 g/dL (ref 1.5–4.5)
Glucose: 158 mg/dL — ABNORMAL HIGH (ref 65–99)
Potassium: 4.4 mmol/L (ref 3.5–5.2)
Sodium: 142 mmol/L (ref 134–144)
Total Protein: 7.8 g/dL (ref 6.0–8.5)

## 2018-12-26 LAB — MICROALBUMIN / CREATININE URINE RATIO
Creatinine, Urine: 97.5 mg/dL
Microalb/Creat Ratio: 16 mg/g creat (ref 0–29)
Microalbumin, Urine: 16 ug/mL

## 2018-12-26 LAB — HGB A1C W/O EAG: Hgb A1c MFr Bld: 7.6 % — ABNORMAL HIGH (ref 4.8–5.6)

## 2018-12-26 LAB — LIPID PANEL W/O CHOL/HDL RATIO
CHOLESTEROL TOTAL: 133 mg/dL (ref 100–199)
HDL: 37 mg/dL — ABNORMAL LOW (ref >39)
LDL Calculated: 72 mg/dL (ref 0–99)
Triglycerides: 120 mg/dL (ref 0–149)
VLDL Cholesterol Cal: 24 mg/dL (ref 5–40)

## 2019-01-26 ENCOUNTER — Other Ambulatory Visit: Payer: Self-pay

## 2019-01-26 MED ORDER — ATORVASTATIN CALCIUM 80 MG PO TABS: ORAL_TABLET | ORAL | 11 refills | Status: DC

## 2019-02-07 ENCOUNTER — Telehealth: Payer: Self-pay | Admitting: Internal Medicine

## 2019-02-07 NOTE — Telephone Encounter (Signed)
Patient's son called to provide BP reading numbers for patient taken today around 2:00 pm. BP was 145/75.  Please advise.

## 2019-02-07 NOTE — Telephone Encounter (Signed)
Working on getting her in for dizziness.  BP appears to be noncontributory.

## 2019-02-08 ENCOUNTER — Telehealth: Payer: Self-pay | Admitting: Cardiology

## 2019-02-08 ENCOUNTER — Ambulatory Visit: Payer: Self-pay | Admitting: Internal Medicine

## 2019-02-08 ENCOUNTER — Other Ambulatory Visit: Payer: Self-pay

## 2019-02-08 ENCOUNTER — Encounter: Payer: Self-pay | Admitting: Internal Medicine

## 2019-02-08 VITALS — BP 138/80 | HR 70 | Resp 12 | Ht <= 58 in | Wt 124.0 lb

## 2019-02-08 DIAGNOSIS — R42 Dizziness and giddiness: Secondary | ICD-10-CM

## 2019-02-08 DIAGNOSIS — E118 Type 2 diabetes mellitus with unspecified complications: Secondary | ICD-10-CM

## 2019-02-08 NOTE — Progress Notes (Signed)
  Subjective:    Patient ID: Stephanie Cooke, female   DOB: 07/17/1961, 58 y.o.   MRN: [id number]   HPI   Episode of dizziness when awakened to go to the bathroom night before last.  Was about 1:30 a.m.  She states she awakened on her right side and the dizziness started before she moved.  Describes a spinning sensation.  Not clear if she may have rolled to her right before spinning.   Required her husband to help her to the bathroom due to the dizziness.  She developed nausea with this and dry heaved.   Her son checked her sugar and it was 147 at the time.   Blood pressure was ok at the time as well. States her ankles felt numb bilaterally with this, but no focal weakness. She went back to bed and her dizziness resolved in about 25 minutes.  She states she did note if she lay still, the dizziness would go away.  Fell back to sleep. She awakened later in the morning without any more symptoms.  Has not recurred. No sense of light headedness.  Only the spinning.  She may have had a mild sense of dizziness in the week preceding.  Current Meds  Medication Sig  . AGAMATRIX ULTRA-THIN LANCETS MISC Check blood glucose twice daily before meals  . aspirin EC 81 MG EC tablet Take 1 tablet (81 mg total) by mouth daily.  . atorvastatin (LIPITOR) 80 MG tablet 1 tab by mouth daily with evening meal  . Blood Glucose Monitoring Suppl (AGAMATRIX PRESTO) w/Device KIT Check blood glucose twice daily before meals.  . glipiZIDE (GLUCOTROL) 5 MG tablet 1 tab by mouth twice daily with a meal  . glucose blood (AGAMATRIX PRESTO TEST) test strip Check blood glucose twice daily before meals  . lisinopril (PRINIVIL,ZESTRIL) 5 MG tablet Take 1 tablet (5 mg total) by mouth daily.  . metFORMIN (GLUCOPHAGE-XR) 500 MG 24 hr tablet 1 tab by mouth twice daily with meals  . metoprolol succinate (TOPROL-XL) 25 MG 24 hr tablet Take 1 tablet (25 mg total) by mouth daily.  . nitroGLYCERIN (NITROSTAT) 0.4 MG SL tablet Place 1  tablet (0.4 mg total) under the tongue every 5 (five) minutes x 3 doses as needed for chest pain.  . ticagrelor (BRILINTA) 90 MG TABS tablet Take 1 tablet (90 mg total) by mouth 2 (two) times daily.   No Known Allergies   Review of Systems    Objective:   BP 138/80 (BP Location: Left Arm, Patient Position: Sitting, Cuff Size: Normal)   Pulse 70   Resp 12   Ht 4' 9.5" (1.461 m)   Wt 124 lb (56.2 kg)   LMP  (LMP Unknown)   BMI 26.37 kg/m   Physical Exam  NAD HEENT:  PERRL, EOMI, TMs pearly gray, throat without injection.   Neck: Supple, No adenopathy Chest:  CTA CV: RRR without murmur or rub.  Carotid, radial and DP pulses normal and equal. Abd:  S, NT, No HSM or mass, + BS LE:  No edema. Neuro:  A & O x 3, CN II-XII grossly intact DTRs 2+/4, Motor 5/5 , sensory grossly normal. No nystagmus.   Finger to nose to finger, rapid alternating movements, heel to shin and Romberg all normal. Gait normal   Assessment & Plan   Vertigo:  No findings on exam today.  Suspect peripheral etiology of Vertigo and likely Labyrinthitis. To call if recurs and pay attention to whether turning her   head a certain way seems to set off. 

## 2019-02-08 NOTE — Telephone Encounter (Signed)
LVM regarding video or phoen visits.

## 2019-02-13 ENCOUNTER — Telehealth: Payer: Self-pay | Admitting: Cardiology

## 2019-02-20 ENCOUNTER — Telehealth: Payer: Self-pay | Admitting: Physician Assistant

## 2019-02-20 NOTE — Telephone Encounter (Signed)
Patient's son wants his Mom to have an office Visit.Stephanie Cooke staff was informed.

## 2019-02-22 ENCOUNTER — Other Ambulatory Visit: Payer: Self-pay

## 2019-02-22 ENCOUNTER — Ambulatory Visit: Payer: Self-pay | Admitting: Physician Assistant

## 2019-02-22 ENCOUNTER — Encounter: Payer: Self-pay | Admitting: Physician Assistant

## 2019-02-22 VITALS — BP 112/70 | HR 80 | Ht <= 58 in | Wt 124.0 lb

## 2019-02-22 DIAGNOSIS — E118 Type 2 diabetes mellitus with unspecified complications: Secondary | ICD-10-CM

## 2019-02-22 DIAGNOSIS — E782 Mixed hyperlipidemia: Secondary | ICD-10-CM

## 2019-02-22 DIAGNOSIS — I10 Essential (primary) hypertension: Secondary | ICD-10-CM

## 2019-02-22 DIAGNOSIS — I2510 Atherosclerotic heart disease of native coronary artery without angina pectoris: Secondary | ICD-10-CM

## 2019-02-22 NOTE — Progress Notes (Signed)
Cardiology Office Note    Date:  02/22/2019   ID:  Sumi Lye, DOB Aug 08, 1961, MRN [id number]  PCP:  Mack Hook, MD  Cardiologist: Dr. Martinique   Coronary artery disease Follow-up  History of Present Illness:  Stephanie Cooke is a 58 y.o. female patient presents today for follow-up and management of her coronary artery disease, hypertension, HL,  and type 2 diabetes.  She presented to the ED in January 2020 with inferior STEMI and ACS symptoms.   ECG confirmed inferior STEMI.  She was taken to the Cath Lab, angiography revealed 3-vessel disease she had successful PCI of OM with DES x1 PCI of first OM and DES to second OM.  RCA 70% proximal medical management.  She presents today with her son.  Attempted to use interpreter.  They speak [place] Arabic, which was a dialect that the interpreter did not understand.  Son was able to interpret and translate.  She endorses compliance with Brilinta aspirin metoprolol 25 mg lisinopril 5 mg and atorvastatin 80 mg daily.  Most recent labs from December 25, 2018 showed LDL 72 and A1c 7.6.  She denies chest pain, shortness of breath, activity intolerance, orthopnea, PND, lower extremity swelling, and activity intolerance.  She denies hematuria, dark tarry stools, or other bleeding issues.  She endorses tolerating ADLs and endorses frequent daily walks around her neighborhood.    Past Medical History:  Diagnosis Date  . Diabetes mellitus without complication ([place]) 72/62/0355  . HTN (hypertension) 10/30/2018  . Hyperlipidemia LDL goal <70 10/30/2018  . STEMI involving oth coronary artery of inferior wall ([place]) 10/30/2018    Past Surgical History:  Procedure Laterality Date  . CHOLECYSTECTOMY N/A 01/05/2018   Procedure: LAPAROSCOPIC CHOLECYSTECTOMY;  Surgeon: Rolm Bookbinder, MD;  Location: [facility];  Service: General;  Laterality: N/A;  . CORONARY STENT INTERVENTION N/A 10/30/2018   Procedure: CORONARY STENT INTERVENTION;  Surgeon: Martinique,  Peter M, MD;  Location: [facility];  Service: Cardiovascular;  Laterality: N/A;  . CORONARY/GRAFT ACUTE MI REVASCULARIZATION N/A 10/30/2018   Procedure: Coronary/Graft Acute MI Revascularization;  Surgeon: Martinique, Peter M, MD;  Location: [facility];  Service: Cardiovascular;  Laterality: N/A;  . LEFT HEART CATH AND CORONARY ANGIOGRAPHY N/A 10/30/2018   Procedure: LEFT HEART CATH AND CORONARY ANGIOGRAPHY;  Surgeon: Martinique, Peter M, MD;  Location: [facility];  Service: Cardiovascular;  Laterality: N/A;    Current Medications: Outpatient Medications Prior to Visit  Medication Sig Dispense Refill  . AGAMATRIX ULTRA-THIN LANCETS MISC Check blood glucose twice daily before meals 60 each 11  . aspirin EC 81 MG EC tablet Take 1 tablet (81 mg total) by mouth daily. 90 tablet 3  . atorvastatin (LIPITOR) 80 MG tablet 1 tab by mouth daily with evening meal 30 tablet 11  . Blood Glucose Monitoring Suppl (AGAMATRIX PRESTO) w/Device KIT Check blood glucose twice daily before meals. 1 kit 0  . glipiZIDE (GLUCOTROL) 5 MG tablet 1 tab by mouth twice daily with a meal 60 tablet 11  . glucose blood (AGAMATRIX PRESTO TEST) test strip Check blood glucose twice daily before meals 60 each 11  . lisinopril (PRINIVIL,ZESTRIL) 5 MG tablet Take 1 tablet (5 mg total) by mouth daily. 30 tablet 11  . metFORMIN (GLUCOPHAGE-XR) 500 MG 24 hr tablet 1 tab by mouth twice daily with meals 60 tablet 11  . metoprolol succinate (TOPROL-XL) 25 MG 24 hr tablet Take 1 tablet (25 mg total) by mouth daily. 30 tablet 6  .  nitroGLYCERIN (NITROSTAT) 0.4 MG SL tablet Place 1 tablet (0.4 mg total) under the tongue every 5 (five) minutes x 3 doses as needed for chest pain. 25 tablet 12  . ticagrelor (BRILINTA) 90 MG TABS tablet Take 1 tablet (90 mg total) by mouth 2 (two) times daily. 60 tablet 11   No facility-administered medications prior to visit.      Allergies:   Patient has no known allergies.   Social History    Socioeconomic History  . Marital status: Married    Spouse name: Not on file  . Number of children: Not on file  . Years of education: Not on file  . Highest education level: Not on file  Occupational History  . Not on file  Social Needs  . Financial resource strain: Not on file  . Food insecurity:    Worry: Not on file    Inability: Not on file  . Transportation needs:    Medical: Not on file    Non-medical: Not on file  Tobacco Use  . Smoking status: Former Research scientist (life sciences)  . Smokeless tobacco: Never Used  Substance and Sexual Activity  . Alcohol use: Not Currently  . Drug use: Not Currently  . Sexual activity: Not on file  Lifestyle  . Physical activity:    Days per week: Not on file    Minutes per session: Not on file  . Stress: Not on file  Relationships  . Social connections:    Talks on phone: Not on file    Gets together: Not on file    Attends religious service: Not on file    Active member of club or organization: Not on file    Attends meetings of clubs or organizations: Not on file    Relationship status: Not on file  Other Topics Concern  . Not on file  Social History Narrative  . Not on file     Family History:  The patient's family history includes Hypertension in her maternal aunt.   ROS:    Review of Systems  Constitution: Negative.  Cardiovascular: Negative for chest pain, leg swelling, orthopnea, palpitations and paroxysmal nocturnal dyspnea.  Respiratory: Negative for cough, shortness of breath and wheezing.   Musculoskeletal: Negative for falls and myalgias.  Gastrointestinal: Negative for blood in stool.  Genitourinary: Negative for hematuria.  Neurological: Negative for dizziness and weakness.     PHYSICAL EXAM:   VS:  LMP  (LMP Unknown)    GEN: Well nourished, well developed, in no acute distress  HEENT: normal  Neck: no JVD, carotid bruits, or masses Cardiac: RRR; no murmurs, rubs, or gallops,no edema  Respiratory:  clear to  auscultation bilaterally, normal work of breathing GI: soft, nontender, nondistended, + BS MS: no deformity or atrophy  Skin: warm and dry, no rash Neuro:  Alert and Oriented x 3, Strength and sensation are intact Psych: euthymic mood, full affect  Wt Readings from Last 3 Encounters:  02/08/19 124 lb (56.2 kg)  12/25/18 123 lb (55.8 kg)  11/14/18 126 lb 8 oz (57.4 kg)      Studies/Labs Reviewed:   EKG:  EKG is Normal sinus rhythm ordered today.  The ekg ordered today demonstrates nonspecific T wave inversion lead III.  Recent Labs: 10/31/2018: Hemoglobin 13.8; Magnesium 1.9; Platelets 231 12/25/2018: ALT 18; BUN 13; Creatinine, Ser 0.62; Potassium 4.4; Sodium 142  12/25/2018: Total Cholesterol 133, HDL 37, LDL 72, Triglycerides 120 Lipid Panel    Component Value Date/Time   CHOL 133  12/25/2018 1013   TRIG 120 12/25/2018 1013   HDL 37 (L) 12/25/2018 1013   CHOLHDL 4.5 10/30/2018 1635   VLDL 31 10/30/2018 1635   LDLCALC 72 12/25/2018 1013    Additional studies/ records that were reviewed today include:    TRANSTHORACIC ECHOCARDIOGRAM1/28/20 IMPRESSIONS 1. The left ventricle appears to be normal in size, has normal wall thickness 60-65% ejection fraction Spectral Doppler shows indeterminate pattern of diastolic filling. 2. Right ventricular systolic pressure is could not be assessed. 3. The right ventricle has normal size and normal systolic function. 4. Normal left atrial size. 5. Normal right atrial size. 6. Mitral valve regurgitation is trivial by color flow Doppler. 7. The mitral valve normal in structure and function. 8. Normal tricuspid valve. 9. Aortic valve normal. 10. No atrial level shunt detected by color flow Doppler  Coronary/Graft Acute MI Revascularization1/27/20  CORONARY STENT INTERVENTION  LEFT HEART CATH AND CORONARY ANGIOGRAPHY  Conclusion     2nd Mrg lesion is 99% stenosed.  Ost 1st Mrg to 1st Mrg lesion is 85% stenosed.  Prox  RCA lesion is 70% stenosed.  Post intervention, there is a 0% residual stenosis.  A drug-eluting stent was successfully placed using a STENT SYNERGY DES 2.25X16.  Post intervention, there is a 0% residual stenosis.  A drug-eluting stent was successfully placed using a STENT SYNERGY DES 2.25X16.  The left ventricular systolic function is normal.  LV end diastolic pressure is normal.  The left ventricular ejection fraction is 55-65% by visual estimate.  1. 3 vessel obstructive CAD. - 85% first OM -99% second OM. This is the culprit lesion - 70% proximal RCA 2. Normal LV function 3. Normal LVEDP 4. Successful PCI of the second OM with DES x 1 5. Successful PCI of the first OM with DES x 1.        ASSESSMENT AND PLAN:  In order of problems listed above:  1. CAD: S/P inferior MI s/p DES to the first OM and second OM.  She has 70% proximal RCA lesion which is being treated medically.  She has no new complaints at this time.  Diet, exercise, medication compliance, medical management of RCA, and today's EKG were reviewed. 2. Hypercholesterolemia: She is tolerating high-dose statin well.  12-25-18 labs LDL 72.  Goal of LDL < 70. 3. Hypertension: Goal less than 562 systolic.  Her BP 112/70 today, son will continue to monitor. 4. Diabetes: Most recent A1c 7.6.  PCP to follow.   Patient stable today and tolerating medications well.  Medications and EKG reviewed with patient.  No changes.  All questions answered.  Patient and son expressed understanding.  Follow-up with Dr. Martinique 3 to 4 months.   Medication Adjustments/Labs and Tests Ordered: Current medicines are reviewed at length with the patient today.  Concerns regarding medicines are outlined above.  Medication changes, Labs and Tests ordered today are listed in the Patient Instructions below. There are no Patient Instructions on file for this visit.   Alicia Amel NP-C 02/22/2019 9:05 AM    [address], [place], [place]  [address] Phone: [phone]; Fax: [phone]

## 2019-02-22 NOTE — Patient Instructions (Signed)
Medication Instructions:   Your physician recommends that you continue on your current medications as directed. Please refer to the Current Medication list given to you today.  If you need a refill on your cardiac medications before your next appointment, please call your pharmacy.   Lab work:  NONE ordered at this time of appointment   If you have labs (blood work) drawn today and your tests are completely normal, you will receive your results only by: . MyChart Message (if you have MyChart) OR . A paper copy in the mail If you have any lab test that is abnormal or we need to change your treatment, we will call you to review the results.  Testing/Procedures:  NONE ordered at this time of appointment   Follow-Up: At [facility], you and your health needs are our priority.  As part of our continuing mission to provide you with exceptional heart care, we have created designated Provider Care Teams.  These Care Teams include your primary Cardiologist (physician) and Advanced Practice Providers (APPs -  Physician Assistants and Nurse Practitioners) who all work together to provide you with the care you need, when you need it. You will need a follow up appointment in 3-4 months.  Please call our office 2 months in advance to schedule this appointment.  You may see Peter Jordan, MD or one of the following Advanced Practice Providers on your designated Care Team: Hao Meng, PA-C . Angela Duke, PA-C  Any Other Special Instructions Will Be Listed Below (If Applicable).    

## 2019-02-22 NOTE — Progress Notes (Signed)
Created in error

## 2019-04-02 ENCOUNTER — Other Ambulatory Visit: Payer: Self-pay

## 2019-04-02 ENCOUNTER — Encounter: Payer: Self-pay | Admitting: Internal Medicine

## 2019-04-02 ENCOUNTER — Ambulatory Visit: Payer: Self-pay | Admitting: Internal Medicine

## 2019-04-02 VITALS — BP 126/74 | HR 72 | Resp 12 | Ht <= 58 in | Wt 127.0 lb

## 2019-04-02 DIAGNOSIS — E119 Type 2 diabetes mellitus without complications: Secondary | ICD-10-CM

## 2019-04-02 DIAGNOSIS — R42 Dizziness and giddiness: Secondary | ICD-10-CM

## 2019-04-02 DIAGNOSIS — I10 Essential (primary) hypertension: Secondary | ICD-10-CM

## 2019-04-02 DIAGNOSIS — E782 Mixed hyperlipidemia: Secondary | ICD-10-CM

## 2019-04-02 DIAGNOSIS — I2119 ST elevation (STEMI) myocardial infarction involving other coronary artery of inferior wall: Secondary | ICD-10-CM

## 2019-04-02 LAB — GLUCOSE, POCT (MANUAL RESULT ENTRY): POC Glucose: 232 mg/dl — AB (ref 70–99)

## 2019-04-02 NOTE — Progress Notes (Signed)
    Subjective:    Patient ID: Stephanie Cooke, female   DOB: [birth year], 59 y.o.   MRN: [id number]   HPI   1.  DM:  She is checking her sugars, but continues to ask someone to help her. They are documenting sugars.  Generally running low 100s to 165.  Occasionally up to low 200s.    2.  Hypertension:  Taking meds and well controlled.  3.  Hyperlipidemia:  Doing well with meds.  Was improved, but not quite at goal when checked in March.  Plan was to check cholesterol today.  4.  HM:  Did hear from Mammography.  They have played phone tag to get this scheduled from sounds of it.  Asked son to call again.  5.  CAD:  No chest pain or dyspnea, particularly with walking.  Walking outside without pain.   No leg swelling. No PND or orthopnea.  6.  Vertigo:  Resolved.  Current Meds  Medication Sig  . AGAMATRIX ULTRA-THIN LANCETS MISC Check blood glucose twice daily before meals  . aspirin EC 81 MG EC tablet Take 1 tablet (81 mg total) by mouth daily.  Marland Kitchen atorvastatin (LIPITOR) 80 MG tablet 1 tab by mouth daily with evening meal  . Blood Glucose Monitoring Suppl (AGAMATRIX PRESTO) w/Device KIT Check blood glucose twice daily before meals.  Marland Kitchen glipiZIDE (GLUCOTROL) 5 MG tablet 1 tab by mouth twice daily with a meal  . glucose blood (AGAMATRIX PRESTO TEST) test strip Check blood glucose twice daily before meals  . lisinopril (PRINIVIL,ZESTRIL) 5 MG tablet Take 1 tablet (5 mg total) by mouth daily.  . metFORMIN (GLUCOPHAGE-XR) 500 MG 24 hr tablet 1 tab by mouth twice daily with meals  . metoprolol succinate (TOPROL-XL) 25 MG 24 hr tablet Take 1 tablet (25 mg total) by mouth daily.  . nitroGLYCERIN (NITROSTAT) 0.4 MG SL tablet Place 1 tablet (0.4 mg total) under the tongue every 5 (five) minutes x 3 doses as needed for chest pain.  . ticagrelor (BRILINTA) 90 MG TABS tablet Take 1 tablet (90 mg total) by mouth 2 (two) times daily.   No Known Allergies   Review of Systems    Objective:   BP  126/74 (BP Location: Left Arm, Patient Position: Sitting, Cuff Size: Normal)   Pulse 72   Resp 12   Ht 4' 9.5" (1.461 m)   Wt 127 lb (57.6 kg)   LMP  (LMP Unknown)   BMI 27.01 kg/m   Physical Exam  NAD Lungs:  CTA CV:  RRR with normal S1 and S2, No S3, S4 or murmur. Carotid,  Radial an DP pulses normal and equal LE:  No edema.   Assessment & Plan   1.  DM:  Sugars appear generally improved.  Encouraged continuing to check twice daily until we have her A1C back in about 1 week.  2.  Hypertension:  Controlled.  3.  CAD:  No symptoms.  Continues on Brilinta.  4.  HM:  To call mammography back and get that set up.  CPE scheduled for 3-4 months.  5.  Encouraged learning to read and write.  Son already has knowledge of where to go for this, but she thus far has not shown interest.  Discussed at length the benefits.

## 2019-04-19 ENCOUNTER — Other Ambulatory Visit: Payer: Self-pay

## 2019-04-19 DIAGNOSIS — E119 Type 2 diabetes mellitus without complications: Secondary | ICD-10-CM

## 2019-04-19 DIAGNOSIS — E782 Mixed hyperlipidemia: Secondary | ICD-10-CM

## 2019-04-20 LAB — LIPID PANEL W/O CHOL/HDL RATIO
Cholesterol, Total: 146 mg/dL (ref 100–199)
HDL: 47 mg/dL (ref >39)
LDL Calculated: 83 mg/dL (ref 0–99)
Triglycerides: 81 mg/dL (ref 0–149)
VLDL Cholesterol Cal: 16 mg/dL (ref 5–40)

## 2019-04-20 LAB — HGB A1C W/O EAG: Hgb A1c MFr Bld: 6.8 % — ABNORMAL HIGH (ref 4.8–5.6)

## 2019-05-25 ENCOUNTER — Encounter

## 2019-06-25 ENCOUNTER — Encounter

## 2019-07-31 ENCOUNTER — Encounter: Payer: Self-pay | Admitting: Internal Medicine

## 2019-07-31 ENCOUNTER — Ambulatory Visit: Payer: Self-pay | Admitting: Internal Medicine

## 2019-07-31 ENCOUNTER — Other Ambulatory Visit: Payer: Self-pay

## 2019-07-31 VITALS — BP 138/80 | HR 72 | Resp 12 | Ht <= 58 in | Wt 122.0 lb

## 2019-07-31 DIAGNOSIS — E119 Type 2 diabetes mellitus without complications: Secondary | ICD-10-CM

## 2019-07-31 DIAGNOSIS — R05 Cough: Secondary | ICD-10-CM

## 2019-07-31 DIAGNOSIS — Z79899 Other long term (current) drug therapy: Secondary | ICD-10-CM

## 2019-07-31 DIAGNOSIS — Z1231 Encounter for screening mammogram for malignant neoplasm of breast: Secondary | ICD-10-CM

## 2019-07-31 DIAGNOSIS — Z1159 Encounter for screening for other viral diseases: Secondary | ICD-10-CM

## 2019-07-31 DIAGNOSIS — Z23 Encounter for immunization: Secondary | ICD-10-CM

## 2019-07-31 DIAGNOSIS — E782 Mixed hyperlipidemia: Secondary | ICD-10-CM

## 2019-07-31 DIAGNOSIS — R059 Cough, unspecified: Secondary | ICD-10-CM

## 2019-07-31 DIAGNOSIS — J9801 Acute bronchospasm: Secondary | ICD-10-CM

## 2019-07-31 LAB — GLUCOSE, POCT (MANUAL RESULT ENTRY): POC Glucose: 336 mg/dl — AB (ref 70–99)

## 2019-07-31 LAB — POC INFLUENZA TEST: Negative: NEGATIVE

## 2019-07-31 MED ORDER — CETIRIZINE HCL 10 MG PO TABS: 10.0000 mg | ORAL_TABLET | Freq: Every day | ORAL | 11 refills | Status: DC

## 2019-07-31 MED ORDER — ALBUTEROL SULFATE HFA 108 (90 BASE) MCG/ACT IN AERS: 2.0000 | INHALATION_SPRAY | Freq: Four times a day (QID) | RESPIRATORY_TRACT | 0 refills | Status: DC | PRN

## 2019-07-31 NOTE — Progress Notes (Signed)
Subjective:    Patient ID: Stephanie Cooke, female   DOB: [birth year], 58 y.o.   MRN: [id number]   HPI    1.  Congested cough:  Was to have CPE, but once in room, clear she is having a cough. Has had for 3-4 days.   Happens every year this time. May last 10-11 days. No itchy, watery eyes or nose.   No sneezing.   Does have itchy throat.   Symptoms come and go. Does have posterior pharyngeal drainage. No fever, headache, bodyaches.   No nausea, vomiting or diarrhea. Has not been out of home except to go for a walk. Lives with husband, son and his wife and their kids. Son, who interprets, is premed online only. He currently works in a lab--temps taken and masks worn. Husband--works in packing company and temps taken, masks worn. No one with illness in either company.  2.  DM:  Not checking feet nightly.  Has not had eye check  3.  Hypertension:  Has not taken meds today.  4.  HM:  Has not had influenza vaccine ever.  Discussed risks/benefits and importance during pandemic.    Current Meds  Medication Sig  . aspirin EC 81 MG EC tablet Take 1 tablet (81 mg total) by mouth daily.  Marland Kitchen atorvastatin (LIPITOR) 80 MG tablet 1 tab by mouth daily with evening meal  . glipiZIDE (GLUCOTROL) 5 MG tablet 1 tab by mouth twice daily with a meal  . lisinopril (PRINIVIL,ZESTRIL) 5 MG tablet Take 1 tablet (5 mg total) by mouth daily.  . metFORMIN (GLUCOPHAGE-XR) 500 MG 24 hr tablet 1 tab by mouth twice daily with meals  . metoprolol succinate (TOPROL-XL) 25 MG 24 hr tablet Take 1 tablet (25 mg total) by mouth daily.  . nitroGLYCERIN (NITROSTAT) 0.4 MG SL tablet Place 1 tablet (0.4 mg total) under the tongue every 5 (five) minutes x 3 doses as needed for chest pain.  . ticagrelor (BRILINTA) 90 MG TABS tablet Take 1 tablet (90 mg total) by mouth 2 (two) times daily.   No Known Allergies      Review of Systems    Objective:   BP 138/80 (BP Location: Left Arm, Patient Position: Sitting,  Cuff Size: Normal)   Pulse 72   Resp 12   Ht 4' 9.5" (1.461 m)   Wt 122 lb (55.3 kg)   LMP  (LMP Unknown)   BMI 25.94 kg/m   Physical Exam  NAD, but intermittent congested cough Neck:  Supple, No adenopathy Chest:  Scattered expiratory wheeze that clears with deep breathing. CV:  RRR with normal S1 and S2, No S3, S4 or murmur.  Radial and DP pulses normal and equal LE:  No edema.  Assessment & Plan   1.  Cough and bronchospasm:  Suspect allergic in nature as states has this every fall when leaves fall. COVID and influenza testing performed outside in car with PPE. Rapid influenza testing negative. Call if symptoms worsen Rx for Albuterol HFA 2 puffs every 6 hours as needed. Called [facility] for sample--they will provide. Zyrtec 10 mg daily until first hard freeze. Son given information regarding self isolation for mother and quarantine for rest of family pending covid results.  2.  DM:  A1C, CMP Given information about [facility] for yearly eye exam  3.  Hypertension/CAD:  Controlled.  CMP, CBC  4.  Hyperlipidemia:  FLP  5.  HM:  Mammogram reordered, Hep C screen, flu vaccine today.  Guaiac cards to return in 2 weeks. Return in 3 months for rest of CPE.

## 2019-07-31 NOTE — Patient Instructions (Signed)
Please call in progress report daily  Follow CDC recommendations for self isolation for Stephanie Cooke until results back and quarantine for rest of family.

## 2019-08-01 ENCOUNTER — Telehealth: Payer: Self-pay | Admitting: Internal Medicine

## 2019-08-01 LAB — HEPATITIS C ANTIBODY: Hep C Virus Ab: 0.1 s/co ratio (ref 0.0–0.9)

## 2019-08-01 LAB — CBC WITH DIFFERENTIAL/PLATELET
Basophils Absolute: 0.1 10*3/uL (ref 0.0–0.2)
Basos: 1 %
EOS (ABSOLUTE): 0.2 10*3/uL (ref 0.0–0.4)
Eos: 2 %
Hematocrit: 47.2 % — ABNORMAL HIGH (ref 34.0–46.6)
Hemoglobin: 15.2 g/dL (ref 11.1–15.9)
Immature Grans (Abs): 0 10*3/uL (ref 0.0–0.1)
Immature Granulocytes: 0 %
Lymphocytes Absolute: 2.5 10*3/uL (ref 0.7–3.1)
Lymphs: 27 %
MCH: 28.4 pg (ref 26.6–33.0)
MCHC: 32.2 g/dL (ref 31.5–35.7)
MCV: 88 fL (ref 79–97)
Monocytes Absolute: 0.5 10*3/uL (ref 0.1–0.9)
Monocytes: 5 %
Neutrophils Absolute: 5.8 10*3/uL (ref 1.4–7.0)
Neutrophils: 65 %
Platelets: 270 10*3/uL (ref 150–450)
RBC: 5.36 x10E6/uL — ABNORMAL HIGH (ref 3.77–5.28)
RDW: 12.8 % (ref 11.7–15.4)
WBC: 9.1 10*3/uL (ref 3.4–10.8)

## 2019-08-01 LAB — COMPREHENSIVE METABOLIC PANEL
ALT: 18 IU/L (ref 0–32)
AST: 15 IU/L (ref 0–40)
Albumin/Globulin Ratio: 1.3 (ref 1.2–2.2)
Albumin: 4.3 g/dL (ref 3.8–4.9)
Alkaline Phosphatase: 177 IU/L — ABNORMAL HIGH (ref 39–117)
BUN/Creatinine Ratio: 17 (ref 12–28)
BUN: 10 mg/dL (ref 8–27)
Bilirubin Total: 0.5 mg/dL (ref 0.0–1.2)
CO2: 25 mmol/L (ref 20–29)
Calcium: 9.6 mg/dL (ref 8.7–10.3)
Chloride: 101 mmol/L (ref 96–106)
Creatinine, Ser: 0.6 mg/dL (ref 0.57–1.00)
GFR calc Af Amer: 115 mL/min/{1.73_m2} (ref >59)
GFR calc non Af Amer: 99 mL/min/{1.73_m2} (ref >59)
Globulin, Total: 3.2 g/dL (ref 1.5–4.5)
Glucose: 358 mg/dL — ABNORMAL HIGH (ref 65–99)
Potassium: 4.4 mmol/L (ref 3.5–5.2)
Sodium: 138 mmol/L (ref 134–144)
Total Protein: 7.5 g/dL (ref 6.0–8.5)

## 2019-08-01 LAB — HGB A1C W/O EAG: Hgb A1c MFr Bld: 12.2 % — ABNORMAL HIGH (ref 4.8–5.6)

## 2019-08-01 LAB — LIPID PANEL W/O CHOL/HDL RATIO
Cholesterol, Total: 138 mg/dL (ref 100–199)
HDL: 42 mg/dL (ref >39)
LDL Chol Calc (NIH): 78 mg/dL (ref 0–99)
Triglycerides: 98 mg/dL (ref 0–149)
VLDL Cholesterol Cal: 18 mg/dL (ref 5–40)

## 2019-08-02 LAB — NOVEL CORONAVIRUS, NAA: SARS-CoV-2, NAA: NOT DETECTED

## 2019-08-02 NOTE — Telephone Encounter (Signed)
Spoke with [facility]. States results are still processing and can take anywhere between 2-5 days to get results.

## 2019-08-06 ENCOUNTER — Other Ambulatory Visit (INDEPENDENT_AMBULATORY_CARE_PROVIDER_SITE_OTHER): Payer: Self-pay

## 2019-08-06 DIAGNOSIS — Z1211 Encounter for screening for malignant neoplasm of colon: Secondary | ICD-10-CM

## 2019-08-06 LAB — POC HEMOCCULT BLD/STL (HOME/3-CARD/SCREEN)
Card #2 Fecal Occult Blod, POC: NEGATIVE
Card #3 Fecal Occult Blood, POC: NEGATIVE
Fecal Occult Blood, POC: NEGATIVE

## 2019-08-15 ENCOUNTER — Other Ambulatory Visit: Payer: Self-pay | Admitting: Internal Medicine

## 2019-08-15 DIAGNOSIS — Z1231 Encounter for screening mammogram for malignant neoplasm of breast: Secondary | ICD-10-CM

## 2019-08-24 ENCOUNTER — Other Ambulatory Visit: Payer: Self-pay | Admitting: Internal Medicine

## 2019-08-24 ENCOUNTER — Other Ambulatory Visit: Payer: Self-pay

## 2019-08-24 MED ORDER — METFORMIN HCL ER 500 MG PO TB24: ORAL_TABLET | ORAL | 3 refills | Status: DC

## 2019-09-17 ENCOUNTER — Other Ambulatory Visit: Payer: Self-pay

## 2019-09-17 DIAGNOSIS — Z20822 Contact with and (suspected) exposure to covid-19: Secondary | ICD-10-CM

## 2019-09-20 LAB — NOVEL CORONAVIRUS, NAA: SARS-CoV-2, NAA: NOT DETECTED

## 2019-10-31 ENCOUNTER — Encounter: Payer: Self-pay | Admitting: Internal Medicine

## 2019-11-04 ENCOUNTER — Encounter: Payer: Self-pay | Admitting: Internal Medicine

## 2019-11-25 ENCOUNTER — Other Ambulatory Visit: Payer: Self-pay | Admitting: Internal Medicine

## 2019-11-26 NOTE — Telephone Encounter (Signed)
She is on long acting now--tartrate is the short acting, so would not fill.

## 2019-12-03 ENCOUNTER — Other Ambulatory Visit: Payer: Self-pay | Admitting: Internal Medicine

## 2020-02-27 ENCOUNTER — Other Ambulatory Visit: Payer: Self-pay | Admitting: Internal Medicine

## 2020-07-29 ENCOUNTER — Other Ambulatory Visit: Payer: Self-pay | Admitting: Internal Medicine

## 2021-08-28 ENCOUNTER — Emergency Department (HOSPITAL_COMMUNITY)
Admission: EM | Admit: 2021-08-28 | Discharge: 2021-08-29 | Disposition: A | Discharge status: Home / Self Care | Payer: Self-pay | Attending: Emergency Medicine | Admitting: Emergency Medicine

## 2021-08-28 ENCOUNTER — Emergency Department (HOSPITAL_COMMUNITY): Payer: Self-pay

## 2021-08-28 ENCOUNTER — Other Ambulatory Visit: Payer: Self-pay

## 2021-08-28 DIAGNOSIS — R1030 Lower abdominal pain, unspecified: Secondary | ICD-10-CM | POA: Insufficient documentation

## 2021-08-28 DIAGNOSIS — R531 Weakness: Secondary | ICD-10-CM | POA: Insufficient documentation

## 2021-08-28 DIAGNOSIS — R509 Fever, unspecified: Secondary | ICD-10-CM | POA: Insufficient documentation

## 2021-08-28 DIAGNOSIS — R42 Dizziness and giddiness: Secondary | ICD-10-CM | POA: Insufficient documentation

## 2021-08-28 DIAGNOSIS — I10 Essential (primary) hypertension: Secondary | ICD-10-CM | POA: Insufficient documentation

## 2021-08-28 DIAGNOSIS — Z87891 Personal history of nicotine dependence: Secondary | ICD-10-CM | POA: Insufficient documentation

## 2021-08-28 DIAGNOSIS — Z20822 Contact with and (suspected) exposure to covid-19: Secondary | ICD-10-CM | POA: Insufficient documentation

## 2021-08-28 DIAGNOSIS — E119 Type 2 diabetes mellitus without complications: Secondary | ICD-10-CM | POA: Insufficient documentation

## 2021-08-28 DIAGNOSIS — R000 Tachycardia, unspecified: Secondary | ICD-10-CM | POA: Insufficient documentation

## 2021-08-28 DIAGNOSIS — R112 Nausea with vomiting, unspecified: Secondary | ICD-10-CM | POA: Insufficient documentation

## 2021-08-28 LAB — URINALYSIS, ROUTINE W REFLEX MICROSCOPIC
Bilirubin Urine: NEGATIVE
Glucose, UA: NEGATIVE mg/dL
Ketones, ur: NEGATIVE mg/dL
Nitrite: NEGATIVE
Protein, ur: NEGATIVE mg/dL
Specific Gravity, Urine: 1.008 (ref 1.005–1.030)
pH: 5 (ref 5.0–8.0)

## 2021-08-28 LAB — COMPREHENSIVE METABOLIC PANEL
ALT: 19 U/L (ref 0–44)
AST: 20 U/L (ref 15–41)
Albumin: 4 g/dL (ref 3.5–5.0)
Alkaline Phosphatase: 92 U/L (ref 38–126)
Anion gap: 12 (ref 5–15)
BUN: 15 mg/dL (ref 8–23)
CO2: 23 mmol/L (ref 22–32)
Calcium: 9.9 mg/dL (ref 8.9–10.3)
Chloride: 105 mmol/L (ref 98–111)
Creatinine, Ser: 0.79 mg/dL (ref 0.44–1.00)
GFR, Estimated: >60 mL/min (ref >60)
Glucose, Bld: 262 mg/dL — ABNORMAL HIGH (ref 70–99)
Potassium: 4.2 mmol/L (ref 3.5–5.1)
Sodium: 140 mmol/L (ref 135–145)
Total Bilirubin: 0.5 mg/dL (ref 0.3–1.2)
Total Protein: 7.5 g/dL (ref 6.5–8.1)

## 2021-08-28 LAB — RESP PANEL BY RT-PCR (FLU A&B, COVID) ARPGX2
Influenza A by PCR: NEGATIVE
Influenza B by PCR: NEGATIVE
SARS Coronavirus 2 by RT PCR: NEGATIVE

## 2021-08-28 LAB — CBC WITH DIFFERENTIAL/PLATELET
Abs Immature Granulocytes: 0.06 10*3/uL (ref 0.00–0.07)
Basophils Absolute: 0.1 10*3/uL (ref 0.0–0.1)
Basophils Relative: 1 %
Eosinophils Absolute: 0.1 10*3/uL (ref 0.0–0.5)
Eosinophils Relative: 1 %
HCT: 48.1 % — ABNORMAL HIGH (ref 36.0–46.0)
Hemoglobin: 16.1 g/dL — ABNORMAL HIGH (ref 12.0–15.0)
Immature Granulocytes: 1 %
Lymphocytes Relative: 34 %
Lymphs Abs: 3.5 10*3/uL (ref 0.7–4.0)
MCH: 28.9 pg (ref 26.0–34.0)
MCHC: 33.5 g/dL (ref 30.0–36.0)
MCV: 86.4 fL (ref 80.0–100.0)
Monocytes Absolute: 0.6 10*3/uL (ref 0.1–1.0)
Monocytes Relative: 5 %
Neutro Abs: 6 10*3/uL (ref 1.7–7.7)
Neutrophils Relative %: 58 %
Platelets: 324 10*3/uL (ref 150–400)
RBC: 5.57 MIL/uL — ABNORMAL HIGH (ref 3.87–5.11)
RDW: 12.3 % (ref 11.5–15.5)
WBC: 10.3 10*3/uL (ref 4.0–10.5)
nRBC: 0 % (ref 0.0–0.2)

## 2021-08-28 LAB — TROPONIN I (HIGH SENSITIVITY): Troponin I (High Sensitivity): 6 ng/L (ref <18)

## 2021-08-28 LAB — URINALYSIS, MICROSCOPIC (REFLEX)

## 2021-08-28 LAB — LIPASE, BLOOD: Lipase: 32 U/L (ref 11–51)

## 2021-08-28 NOTE — ED Triage Notes (Signed)
Pt c/o feeling weak and dizzy for the past 5 days.

## 2021-08-28 NOTE — ED Provider Notes (Addendum)
Emergency Medicine Provider Triage Evaluation Note  Stephanie Cooke , a 60 y.o. female  was evaluated in triage.  Pt presents with her son at the bedside with concern for nausea, vomiting, diarrhea, chills with intermittent abdominal pain for the last 5 days.  Recently exposed to her grandchildren who had influenza.  Endorses pounding in her chest, some shortness of breath feeling that her heart is racing.  Tolerating water and food in between episodes of nausea today.  Approximately 10 episodes of NBNB emesis today.  Feeling lightheaded  Review of Systems  Positive: Lightheadedness, nausea, vomiting, chills, tachycardia, chest discomfort Negative: Fevers  Physical Exam  BP (!) 201/100 (BP Location: Left Arm)   Pulse (!) 124   Temp 97.9 F (36.6 C) (Oral)   Resp 18   Ht 4\' 9"  (1.448 m)   Wt 55 kg   LMP  (LMP Unknown)   SpO2 98%   BMI 26.24 kg/m  Gen:   Awake, no distress   Resp:  Normal effort  MSK:   Moves extremities without difficulty  Other:  Abdomen soft, distended, nontender.  Tachycardic to the 130s at time of my exam.  Lungs CTA B.  Patient is notably hypertensive.  Medical Decision Making  Medically screening exam initiated at 8:45 PM.  Appropriate orders placed.  Cherysh Eyer was informed that the remainder of the evaluation will be completed by another provider, this initial triage assessment does not replace that evaluation, and the importance of remaining in the ED until their evaluation is complete.  Repeat BP in Triage 178/104.  Per son, [place] was [place] and has been out of most of her medications  This chart was dictated using , Chemical engineer. Despite the best efforts of this provider to proofread and correct errors, errors may still occur which can change documentation meaning.     Nurse, children's 08/28/21 2115    [id number], MD 08/28/21 2214

## 2021-08-28 NOTE — ED Provider Notes (Signed)
[facility]-EMERGENCY DEPT [facility] Emergency Department Provider Note MRN:  [id number]  Arrival date & time: 08/29/21     Chief Complaint   Dizziness and Weakness   History of Present Illness   Stephanie Cooke is a 60 y.o. year-old female with a history of hypertension, diabetes, CAD presenting to the ED with chief complaint of dizziness and weakness.  5 days of illness.  Endorsing weakness, dizziness described as lightheadedness.  Nausea and vomiting.  10+ episodes of nonbloody nonbilious emesis today.  Mild to moderate lower abdominal pain.  Denies chest pain or shortness of breath.  Subjective fever at home.  Symptoms mild to moderate, no exacerbating or alleviating factors.  Review of Systems  A complete 10 system review of systems was obtained and all systems are negative except as noted in the HPI and PMH.   Patient's Health History    Past Medical History:  Diagnosis Date   Diabetes mellitus without complication (HCC) 01/04/2018   HTN (hypertension) 10/30/2018   Hyperlipidemia LDL goal <70 10/30/2018   STEMI involving oth coronary artery of inferior wall (HCC) 10/30/2018    Past Surgical History:  Procedure Laterality Date   CHOLECYSTECTOMY N/A 01/05/2018   Procedure: LAPAROSCOPIC CHOLECYSTECTOMY;  Surgeon: Emelia Loron, MD;  Location: [facility] OR;  Service: General;  Laterality: N/A;   CORONARY STENT INTERVENTION N/A 10/30/2018   Procedure: CORONARY STENT INTERVENTION;  Surgeon: Swaziland, Peter M, MD;  Location: [facility] INVASIVE CV LAB;  Service: Cardiovascular;  Laterality: N/A;   CORONARY/GRAFT ACUTE MI REVASCULARIZATION N/A 10/30/2018   Procedure: Coronary/Graft Acute MI Revascularization;  Surgeon: Swaziland, Peter M, MD;  Location: [facility] INVASIVE CV LAB;  Service: Cardiovascular;  Laterality: N/A;   LEFT HEART CATH AND CORONARY ANGIOGRAPHY N/A 10/30/2018   Procedure: LEFT HEART CATH AND CORONARY ANGIOGRAPHY;  Surgeon: Swaziland, Peter M, MD;  Location: [facility] INVASIVE CV LAB;  Service:  Cardiovascular;  Laterality: N/A;    Family History  Problem Relation Age of Onset   Hypertension Maternal Aunt     Social History   Socioeconomic History   Marital status: Married    Spouse name: Not on file   Number of children: Not on file   Years of education: Not on file   Highest education level: Not on file  Occupational History   Not on file  Tobacco Use   Smoking status: Former   Smokeless tobacco: Never  Substance and Sexual Activity   Alcohol use: Not Currently   Drug use: Not Currently   Sexual activity: Not on file  Other Topics Concern   Not on file  Social History Narrative   Not on file   Social Determinants of Health   Financial Resource Strain: Not on file  Food Insecurity: Not on file  Transportation Needs: Not on file  Physical Activity: Not on file  Stress: Not on file  Social Connections: Not on file  Intimate Partner Violence: Not on file     Physical Exam   Vitals:   08/29/21 0115 08/29/21 0130  BP: 138/83 (!) 146/94  Pulse:  95  Resp: 16 (!) 21  Temp:    SpO2:  97%    CONSTITUTIONAL: Well-appearing, NAD NEURO:  Alert and oriented x 3, no focal deficits EYES:  eyes equal and reactive ENT/NECK:  no LAD, no JVD CARDIO: Regular rate, well-perfused, normal S1 and S2 PULM:  CTAB no wheezing or rhonchi GI/GU:  normal bowel sounds, non-distended, non-tender MSK/SPINE:  No gross deformities, no edema SKIN:  no rash,  atraumatic PSYCH:  Appropriate speech and behavior  *Additional and/or pertinent findings included in MDM below  Diagnostic and Interventional Summary    EKG Interpretation  Date/Time:  Friday August 28 2021 23:04:50 EST Ventricular Rate:  101 PR Interval:  151 QRS Duration: 84 QT Interval:  331 QTC Calculation: 429 R Axis:   69 Text Interpretation: Sinus tachycardia LAE, consider biatrial enlargement Low voltage, precordial leads Confirmed by Kennis Carina [phone]) on 08/28/2021 11:06:49 PM       Labs Reviewed   COMPREHENSIVE METABOLIC PANEL - Abnormal; Notable for the following components:      Result Value   Glucose, Bld 262 (*)    All other components within normal limits  CBC WITH DIFFERENTIAL/PLATELET - Abnormal; Notable for the following components:   RBC 5.57 (*)    Hemoglobin 16.1 (*)    HCT 48.1 (*)    All other components within normal limits  URINALYSIS, ROUTINE W REFLEX MICROSCOPIC - Abnormal; Notable for the following components:   APPearance HAZY (*)    Hgb urine dipstick SMALL (*)    Leukocytes,Ua TRACE (*)    All other components within normal limits  URINALYSIS, MICROSCOPIC (REFLEX) - Abnormal; Notable for the following components:   Bacteria, UA RARE (*)    All other components within normal limits  RESP PANEL BY RT-PCR (FLU A&B, COVID) ARPGX2  LIPASE, BLOOD  TROPONIN I (HIGH SENSITIVITY)  TROPONIN I (HIGH SENSITIVITY)    CT HEAD WO CONTRAST ( )  Final Result    CT ABDOMEN PELVIS W CONTRAST  Final Result    DG Chest 2 View  Final Result      Medications  iopamidol (ISOVUE-370) 76 % injection 75 mL (75 mLs Intravenous Contrast Given 08/29/21 0047)     Procedures  /  Critical Care Procedures  ED Course and Medical Decision Making  I have reviewed the triage vital signs, the nursing notes, and pertinent available records from the EMR.  Listed above are laboratory and imaging tests that I personally ordered, reviewed, and interpreted and then considered in my medical decision making (see below for details).  Suspicious for viral illness, especially flu given that patient has multiple family members home with flu symptoms.  Patient has tested negative for flu, negative for COVID.  She is having some abdominal pain, persistent nausea vomiting.  Remainder of work-up thus far unrevealing.  Will obtain CT imaging of the head to exclude mass lesion or elevated ICP, CT imaging of the abdomen to exclude intra-abdominal infection or obstruction.  If reassuring, most  likely explanation would be other viral illness or food poisoning and would likely be appropriate for discharge.       Elmer Sow. Pilar Plate, MD [facility] Emergency Medicine [facility] [email or web address]  Final Clinical Impressions(s) / ED Diagnoses     ICD-10-CM   1. Nausea and vomiting, unspecified vomiting type  R11.2       ED Discharge Orders     None        Discharge Instructions Discussed with and Provided to Patient:     Discharge Instructions      You were evaluated in the Emergency Department and after careful evaluation, we did not find any emergent condition requiring admission or further testing in the hospital.  Your exam/testing today is overall reassuring.  Symptoms likely due to a viral illness.  Your CT scan did show an ovarian cyst, recommend follow-up with your primary care doctor for further management.  Please  return to the Emergency Department if you experience any worsening of your condition.   Thank you for allowing [place] to be a part of your care.        Sabas Sous, MD 08/29/21 [phone]

## 2021-08-29 ENCOUNTER — Emergency Department (HOSPITAL_COMMUNITY): Payer: Self-pay

## 2021-08-29 LAB — TROPONIN I (HIGH SENSITIVITY): Troponin I (High Sensitivity): 8 ng/L (ref <18)

## 2021-08-29 MED ORDER — IOPAMIDOL (ISOVUE-370) INJECTION 76%
75.0000 mL | Freq: Once | INTRAVENOUS | Status: AC | PRN
  Administered 2021-08-29: 75 mL via INTRAVENOUS

## 2021-08-29 NOTE — Discharge Instructions (Signed)
You were evaluated in the Emergency Department and after careful evaluation, we did not find any emergent condition requiring admission or further testing in the hospital.  Your exam/testing today is overall reassuring.  Symptoms likely due to a viral illness.  Your CT scan did show an ovarian cyst, recommend follow-up with your primary care doctor for further management.  Please return to the Emergency Department if you experience any worsening of your condition.   Thank you for allowing [place] to be a part of your care.

## 2021-09-02 ENCOUNTER — Encounter (HOSPITAL_COMMUNITY): Payer: Self-pay | Admitting: Emergency Medicine

## 2021-09-02 ENCOUNTER — Other Ambulatory Visit: Payer: Self-pay

## 2021-09-02 ENCOUNTER — Emergency Department (HOSPITAL_COMMUNITY)
Admission: EM | Admit: 2021-09-02 | Discharge: 2021-09-03 | Disposition: A | Discharge status: Home / Self Care | Payer: Self-pay | Attending: Student | Admitting: Student

## 2021-09-02 ENCOUNTER — Emergency Department (HOSPITAL_COMMUNITY): Payer: Self-pay

## 2021-09-02 DIAGNOSIS — Z20822 Contact with and (suspected) exposure to covid-19: Secondary | ICD-10-CM | POA: Insufficient documentation

## 2021-09-02 DIAGNOSIS — Z5321 Procedure and treatment not carried out due to patient leaving prior to being seen by health care provider: Secondary | ICD-10-CM | POA: Insufficient documentation

## 2021-09-02 DIAGNOSIS — R079 Chest pain, unspecified: Secondary | ICD-10-CM | POA: Insufficient documentation

## 2021-09-02 DIAGNOSIS — R112 Nausea with vomiting, unspecified: Secondary | ICD-10-CM | POA: Insufficient documentation

## 2021-09-02 DIAGNOSIS — R002 Palpitations: Secondary | ICD-10-CM | POA: Insufficient documentation

## 2021-09-02 DIAGNOSIS — R42 Dizziness and giddiness: Secondary | ICD-10-CM | POA: Insufficient documentation

## 2021-09-02 DIAGNOSIS — R519 Headache, unspecified: Secondary | ICD-10-CM | POA: Insufficient documentation

## 2021-09-02 DIAGNOSIS — R059 Cough, unspecified: Secondary | ICD-10-CM | POA: Insufficient documentation

## 2021-09-02 DIAGNOSIS — R6883 Chills (without fever): Secondary | ICD-10-CM | POA: Insufficient documentation

## 2021-09-02 LAB — CBC WITH DIFFERENTIAL/PLATELET
Abs Immature Granulocytes: 0.05 10*3/uL (ref 0.00–0.07)
Basophils Absolute: 0.1 10*3/uL (ref 0.0–0.1)
Basophils Relative: 1 %
Eosinophils Absolute: 0.1 10*3/uL (ref 0.0–0.5)
Eosinophils Relative: 1 %
HCT: 46.6 % — ABNORMAL HIGH (ref 36.0–46.0)
Hemoglobin: 15.5 g/dL — ABNORMAL HIGH (ref 12.0–15.0)
Immature Granulocytes: 1 %
Lymphocytes Relative: 21 %
Lymphs Abs: 2.3 10*3/uL (ref 0.7–4.0)
MCH: 28.4 pg (ref 26.0–34.0)
MCHC: 33.3 g/dL (ref 30.0–36.0)
MCV: 85.3 fL (ref 80.0–100.0)
Monocytes Absolute: 0.6 10*3/uL (ref 0.1–1.0)
Monocytes Relative: 6 %
Neutro Abs: 7.6 10*3/uL (ref 1.7–7.7)
Neutrophils Relative %: 70 %
Platelets: 324 10*3/uL (ref 150–400)
RBC: 5.46 MIL/uL — ABNORMAL HIGH (ref 3.87–5.11)
RDW: 12.5 % (ref 11.5–15.5)
WBC: 10.6 10*3/uL — ABNORMAL HIGH (ref 4.0–10.5)
nRBC: 0 % (ref 0.0–0.2)

## 2021-09-02 MED ORDER — ONDANSETRON 4 MG PO TBDP: 4.0000 mg | ORAL_TABLET | Freq: Once | ORAL | Status: DC

## 2021-09-02 NOTE — ED Provider Notes (Signed)
Emergency Medicine Provider Triage Evaluation Note  Stephanie Cooke , a 60 y.o. female  was evaluated in triage.  Pt complains of palpitations that have been occurring for the past 3 to 4 days.  She felt her heart is racing with some chest pain, lightheadedness, chills, nausea, and vomiting.  No relieving or aggravating factors.  She has also had a cough and headache.  States she was seen for similar recently.  Interpreter utilized throughout Audiological scientist.  Review of Systems  Positive: Chest pain, palpitations, lightheadedness, nausea, vomiting, chills, headaches Negative: Hematemesis, melena, hematochezia, abdominal pain.  Physical Exam  BP (!) 177/92 (BP Location: Left Arm)   Pulse 97   Temp 98.2 F (36.8 C) (Oral)   Resp 18   LMP  (LMP Unknown)   SpO2 100%  Gen:   Awake, no distress   Resp:  Normal effort  MSK:   Moves extremities without difficulty  Other:  Heart RRR on exam.   Medical Decision Making  Medically screening exam initiated at 10:49 PM.  Appropriate orders placed.  Brylin Majeed was informed that the remainder of the evaluation will be completed by another provider, this initial triage assessment does not replace that evaluation, and the importance of remaining in the ED until their evaluation is complete.  Palpitations.   Seen in the ED for similar 08/29/21:  CT A/P:  IMPRESSION: 1. No acute intra-abdominal process. 2. Cardiomegaly. 3. Small hiatal hernia. 4. Prominent left ovary for patient's stated age measuring 3.2 x 2.6 cm. Ultrasound is suggested for further evaluation on a nonemergent basis. 5. Compression deformity at L1 with slightly retropulsed element resulting in mild to moderate spinal canal stenosis which may be chronic. Correlation with physical exam is recommended.  CT head:  IMPRESSION: No acute intracranial pathology.   Desmond Lope 09/02/21 2304    Wynetta Fines, MD 09/02/21 [phone]

## 2021-09-02 NOTE — ED Triage Notes (Signed)
Pt here via [facility] from home forf Heart racing and cp. When EMS got there, that had subsided, pt then had N/V and dizziness, had an episode of emesis w/ EMS. Pt has been noncompliant w/ BP meds x1 mo, pt took 81mg  ASA before EMS arrived. 198/110, 120 HR, CBG 177

## 2021-09-03 LAB — COMPREHENSIVE METABOLIC PANEL
ALT: 19 U/L (ref 0–44)
AST: 22 U/L (ref 15–41)
Albumin: 4 g/dL (ref 3.5–5.0)
Alkaline Phosphatase: 95 U/L (ref 38–126)
Anion gap: 11 (ref 5–15)
BUN: 11 mg/dL (ref 8–23)
CO2: 24 mmol/L (ref 22–32)
Calcium: 10.1 mg/dL (ref 8.9–10.3)
Chloride: 103 mmol/L (ref 98–111)
Creatinine, Ser: 0.67 mg/dL (ref 0.44–1.00)
GFR, Estimated: >60 mL/min (ref >60)
Glucose, Bld: 208 mg/dL — ABNORMAL HIGH (ref 70–99)
Potassium: 4.1 mmol/L (ref 3.5–5.1)
Sodium: 138 mmol/L (ref 135–145)
Total Bilirubin: 0.6 mg/dL (ref 0.3–1.2)
Total Protein: 7.5 g/dL (ref 6.5–8.1)

## 2021-09-03 LAB — LIPASE, BLOOD: Lipase: 32 U/L (ref 11–51)

## 2021-09-03 LAB — TROPONIN I (HIGH SENSITIVITY)
Troponin I (High Sensitivity): 10 ng/L (ref <18)
Troponin I (High Sensitivity): 11 ng/L (ref <18)

## 2021-09-03 LAB — TSH: TSH: 0.996 u[IU]/mL (ref 0.350–4.500)

## 2021-09-03 LAB — RESP PANEL BY RT-PCR (FLU A&B, COVID) ARPGX2
Influenza A by PCR: NEGATIVE
Influenza B by PCR: NEGATIVE
SARS Coronavirus 2 by RT PCR: NEGATIVE

## 2021-09-03 NOTE — ED Notes (Signed)
Pt did not respond to vital check  

## 2021-09-03 NOTE — ED Notes (Signed)
Pt left [facility] signed  

## 2021-09-28 ENCOUNTER — Encounter (HOSPITAL_COMMUNITY): Payer: Self-pay | Admitting: Radiology

## 2021-12-01 ENCOUNTER — Other Ambulatory Visit: Payer: Self-pay

## 2021-12-01 ENCOUNTER — Ambulatory Visit: Payer: Self-pay | Admitting: Internal Medicine

## 2021-12-01 ENCOUNTER — Encounter: Payer: Self-pay | Admitting: Internal Medicine

## 2021-12-01 VITALS — BP 146/92 | HR 88 | Resp 16 | Ht 59.0 in | Wt 116.5 lb

## 2021-12-01 DIAGNOSIS — Z79899 Other long term (current) drug therapy: Secondary | ICD-10-CM

## 2021-12-01 DIAGNOSIS — E782 Mixed hyperlipidemia: Secondary | ICD-10-CM

## 2021-12-01 DIAGNOSIS — E119 Type 2 diabetes mellitus without complications: Secondary | ICD-10-CM

## 2021-12-01 DIAGNOSIS — I10 Essential (primary) hypertension: Secondary | ICD-10-CM

## 2021-12-01 MED ORDER — GLIPIZIDE 5 MG PO TABS: ORAL_TABLET | ORAL | 4 refills | Status: DC

## 2021-12-01 MED ORDER — METFORMIN HCL ER 500 MG PO TB24: ORAL_TABLET | ORAL | 3 refills | Status: DC

## 2021-12-01 MED ORDER — AGAMATRIX PRESTO TEST VI STRP: ORAL_STRIP | 11 refills | Status: DC

## 2021-12-01 MED ORDER — NITROGLYCERIN 0.4 MG SL SUBL: 0.4000 mg | SUBLINGUAL_TABLET | SUBLINGUAL | 12 refills | Status: DC | PRN

## 2021-12-01 MED ORDER — ASPIRIN 81 MG PO TBEC
81.0000 mg | DELAYED_RELEASE_TABLET | Freq: Every day | ORAL | 3 refills | Status: DC
Start: 2021-12-01 — End: 2024-02-02

## 2021-12-01 MED ORDER — LOSARTAN POTASSIUM 50 MG PO TABS: 50.0000 mg | ORAL_TABLET | Freq: Every day | ORAL | 11 refills | Status: DC

## 2021-12-01 MED ORDER — TICAGRELOR 90 MG PO TABS: ORAL_TABLET | ORAL | 11 refills | Status: DC

## 2021-12-01 MED ORDER — AGAMATRIX PRESTO W/DEVICE KIT: PACK | 0 refills | Status: DC

## 2021-12-01 MED ORDER — ATORVASTATIN CALCIUM 80 MG PO TABS: ORAL_TABLET | ORAL | 11 refills | Status: DC

## 2021-12-01 MED ORDER — AGAMATRIX ULTRA-THIN LANCETS MISC: 11 refills | Status: DC

## 2021-12-01 NOTE — Progress Notes (Signed)
    Subjective:    Patient ID: Stephanie Cooke, female   DOB: December 29, 1960, 61 y.o.   MRN: [id number]   HPI  Here to re establish  Was living in [place] again since last seen in 2020.  Family returned to [place]. April 23, 2021.  She was being treated in [place].   She has been out of medication for about 5 months.  Was unable to get medication even when went to ED.      CAD with stent:  she ran out of Brilinta about 1 year ago or more.  Discussed some recommendations for long term treatment.    Hypertension:  Had a cough with Lisinopril  Would like to try something else.  Current Meds  Medication Sig   acetaminophen (TYLENOL) 500 MG tablet Take 500 mg by mouth every 6 (six) hours as needed for headache, fever or moderate pain.   AGAMATRIX ULTRA-THIN LANCETS MISC Check blood glucose twice daily before meals (Patient taking differently: Check blood sugar every other day)   Blood Glucose Monitoring Suppl (AGAMATRIX PRESTO) w/Device KIT Check blood glucose twice daily before meals. (Patient taking differently: Check blood sugar every other day)   ibuprofen (ADVIL) 200 MG tablet Take 400 mg by mouth every 6 (six) hours as needed for headache, fever or mild pain.   Allergies  Allergen Reactions   Lisinopril Cough     Review of Systems  Respiratory:  Negative for shortness of breath.   Cardiovascular:  Negative for chest pain, palpitations and leg swelling.     Objective:   BP (!) 146/92 (BP Location: Right Arm, Patient Position: Sitting, Cuff Size: Normal)   Pulse 88   Resp 16   Ht $R'4\' 11"'wK$  (1.499 m)   Wt 116 lb 8 oz (52.8 kg)   LMP  (LMP Unknown)   BMI 23.53 kg/m   Physical Exam NAD HEENT:  PERRL, EOMI, TMs pearly gray, throat without injection Neck:  Supple, No adenopathy, no thyromegaly Chest:  CTA CV:  RRR with normal S1 and S2, No S3, S4 or murmur.  No carotid bruit.  Carotid, radial and DP pulses normal and equal Abd:  S, NT, No HSM or mass, + BS LE:  No  edema.    Assessment & Plan   HM:  Refused all recommended vaccines.  2.  DM:  A1C, Urine microalbumin/crea, CMP.  Eye referral for diabetic eye exam.  Restart Metformin, Glipizide.  Diabetic monitoring supplies.  3.  History of CAD with stent.  Brilinta with MAP.  ASA 81 mg daily.  NTG as needed  4.  Hyperlipidemia:  FLP.  Atorvastatin.  5.  Hypertension:  Losartan.  Follow up in 1 month for BP check and BMP

## 2021-12-02 LAB — COMPREHENSIVE METABOLIC PANEL
ALT: 11 IU/L (ref 0–32)
AST: 13 IU/L (ref 0–40)
Albumin/Globulin Ratio: 1.8 (ref 1.2–2.2)
Albumin: 4.4 g/dL (ref 3.8–4.8)
Alkaline Phosphatase: 120 IU/L (ref 44–121)
BUN/Creatinine Ratio: 25 (ref 12–28)
BUN: 16 mg/dL (ref 8–27)
Bilirubin Total: 0.3 mg/dL (ref 0.0–1.2)
CO2: 23 mmol/L (ref 20–29)
Calcium: 9.9 mg/dL (ref 8.7–10.3)
Chloride: 105 mmol/L (ref 96–106)
Creatinine, Ser: 0.64 mg/dL (ref 0.57–1.00)
Globulin, Total: 2.5 g/dL (ref 1.5–4.5)
Glucose: 239 mg/dL — ABNORMAL HIGH (ref 70–99)
Potassium: 4.1 mmol/L (ref 3.5–5.2)
Sodium: 142 mmol/L (ref 134–144)
Total Protein: 6.9 g/dL (ref 6.0–8.5)
eGFR: 99 mL/min/{1.73_m2} (ref >59)

## 2021-12-02 LAB — CBC WITH DIFFERENTIAL/PLATELET
Basophils Absolute: 0.1 10*3/uL (ref 0.0–0.2)
Basos: 1 %
EOS (ABSOLUTE): 0.1 10*3/uL (ref 0.0–0.4)
Eos: 1 %
Hematocrit: 46.5 % (ref 34.0–46.6)
Hemoglobin: 15.4 g/dL (ref 11.1–15.9)
Immature Grans (Abs): 0 10*3/uL (ref 0.0–0.1)
Immature Granulocytes: 0 %
Lymphocytes Absolute: 2.6 10*3/uL (ref 0.7–3.1)
Lymphs: 30 %
MCH: 28.6 pg (ref 26.6–33.0)
MCHC: 33.1 g/dL (ref 31.5–35.7)
MCV: 86 fL (ref 79–97)
Monocytes Absolute: 0.5 10*3/uL (ref 0.1–0.9)
Monocytes: 6 %
Neutrophils Absolute: 5.2 10*3/uL (ref 1.4–7.0)
Neutrophils: 62 %
Platelets: 292 10*3/uL (ref 150–450)
RBC: 5.39 x10E6/uL — ABNORMAL HIGH (ref 3.77–5.28)
RDW: 12.8 % (ref 11.7–15.4)
WBC: 8.4 10*3/uL (ref 3.4–10.8)

## 2021-12-02 LAB — MICROALBUMIN / CREATININE URINE RATIO
Creatinine, Urine: 80 mg/dL
Microalb/Creat Ratio: 13 mg/g creat (ref 0–29)
Microalbumin, Urine: 10.6 ug/mL

## 2021-12-02 LAB — LIPID PANEL W/O CHOL/HDL RATIO
Cholesterol, Total: 248 mg/dL — ABNORMAL HIGH (ref 100–199)
HDL: 53 mg/dL (ref >39)
LDL Chol Calc (NIH): 164 mg/dL — ABNORMAL HIGH (ref 0–99)
Triglycerides: 171 mg/dL — ABNORMAL HIGH (ref 0–149)
VLDL Cholesterol Cal: 31 mg/dL (ref 5–40)

## 2021-12-02 LAB — HGB A1C W/O EAG: Hgb A1c MFr Bld: 7.7 % — ABNORMAL HIGH (ref 4.8–5.6)

## 2021-12-29 ENCOUNTER — Other Ambulatory Visit: Payer: Self-pay

## 2022-01-05 ENCOUNTER — Other Ambulatory Visit (INDEPENDENT_AMBULATORY_CARE_PROVIDER_SITE_OTHER): Payer: Self-pay | Admitting: Internal Medicine

## 2022-01-05 VITALS — BP 150/70 | HR 96

## 2022-01-05 DIAGNOSIS — Z79899 Other long term (current) drug therapy: Secondary | ICD-10-CM

## 2022-01-05 DIAGNOSIS — Z0130 Encounter for examination of blood pressure without abnormal findings: Secondary | ICD-10-CM

## 2022-01-05 MED ORDER — CARVEDILOL 3.125 MG PO TABS: ORAL_TABLET | ORAL | 11 refills | Status: DC

## 2022-01-05 NOTE — Progress Notes (Signed)
Patient reported that she is taking losartan consistently and took it this morning. ?

## 2022-01-05 NOTE — Progress Notes (Signed)
Restart beta blocker with Carvedilol 3.125 mg twice daily. ?BP and pulse check in 1 month. ?

## 2022-01-06 LAB — BASIC METABOLIC PANEL
BUN/Creatinine Ratio: 20 (ref 12–28)
BUN: 15 mg/dL (ref 8–27)
CO2: 22 mmol/L (ref 20–29)
Calcium: 9.9 mg/dL (ref 8.7–10.3)
Chloride: 102 mmol/L (ref 96–106)
Creatinine, Ser: 0.76 mg/dL (ref 0.57–1.00)
Glucose: 140 mg/dL — ABNORMAL HIGH (ref 70–99)
Potassium: 4.1 mmol/L (ref 3.5–5.2)
Sodium: 140 mmol/L (ref 134–144)
eGFR: 88 mL/min/{1.73_m2} (ref >59)

## 2022-01-12 NOTE — Progress Notes (Signed)
Patient reported that she is taking bp medication consistently.  ? ?After reporting bp to Dr Delrae Alfred, patient will start Carvedilol 3.125 mg 1 tab by mouth twice daily and will recheck bp in one month ?

## 2022-02-12 ENCOUNTER — Other Ambulatory Visit: Payer: Self-pay

## 2022-02-12 DIAGNOSIS — Z79899 Other long term (current) drug therapy: Secondary | ICD-10-CM

## 2022-02-12 DIAGNOSIS — E782 Mixed hyperlipidemia: Secondary | ICD-10-CM

## 2022-02-12 NOTE — Progress Notes (Signed)
Patient did not take bp medication this morning. Patient has been rescheduled for bp check ?

## 2022-02-13 LAB — LIPID PANEL W/O CHOL/HDL RATIO
Cholesterol, Total: 225 mg/dL — ABNORMAL HIGH (ref 100–199)
HDL: 50 mg/dL (ref >39)
LDL Chol Calc (NIH): 148 mg/dL — ABNORMAL HIGH (ref 0–99)
Triglycerides: 151 mg/dL — ABNORMAL HIGH (ref 0–149)
VLDL Cholesterol Cal: 27 mg/dL (ref 5–40)

## 2022-02-13 LAB — BASIC METABOLIC PANEL
BUN/Creatinine Ratio: 20 (ref 12–28)
BUN: 12 mg/dL (ref 8–27)
CO2: 20 mmol/L (ref 20–29)
Calcium: 9.3 mg/dL (ref 8.7–10.3)
Chloride: 107 mmol/L — ABNORMAL HIGH (ref 96–106)
Creatinine, Ser: 0.59 mg/dL (ref 0.57–1.00)
Glucose: 103 mg/dL — ABNORMAL HIGH (ref 70–99)
Potassium: 4.2 mmol/L (ref 3.5–5.2)
Sodium: 145 mmol/L — ABNORMAL HIGH (ref 134–144)
eGFR: 101 mL/min/{1.73_m2} (ref >59)

## 2022-02-16 ENCOUNTER — Ambulatory Visit: Payer: Self-pay

## 2022-02-16 VITALS — BP 134/74 | HR 76

## 2022-02-16 DIAGNOSIS — Z0130 Encounter for examination of blood pressure without abnormal findings: Secondary | ICD-10-CM

## 2022-02-16 NOTE — Progress Notes (Signed)
Patient reported that she is taking bp medication consistently.  ? ?After reporting bp to Dr Delrae Alfred, no changes to medication will be made ?

## 2022-03-02 ENCOUNTER — Ambulatory Visit: Payer: Self-pay | Admitting: Internal Medicine

## 2022-04-25 IMAGING — CT CT ABD-PELV W/ CM
2 of 5 series · 16 of 46 positions shown, 18 images · IV contrast (APPLIED)
Comparison: None.

CLINICAL DATA: Abdominal pain, acute, nonlocalized. Weakness and
dizziness for 5 days.

EXAM:
CT ABDOMEN AND PELVIS WITH CONTRAST
TECHNIQUE: Multidetector CT imaging of the abdomen and pelvis was performed
using the standard protocol following bolus administration of
intravenous contrast.
CONTRAST:  75mL DDEOKU-350 IOPAMIDOL (DDEOKU-350) INJECTION 76%

[Series 3: abdomen 5.0 · axial · 0.69mm/px · z∈[-769,-409]mm · 13 of 85 slices shown, 15 images]
[im 7/85  soft-tissue]
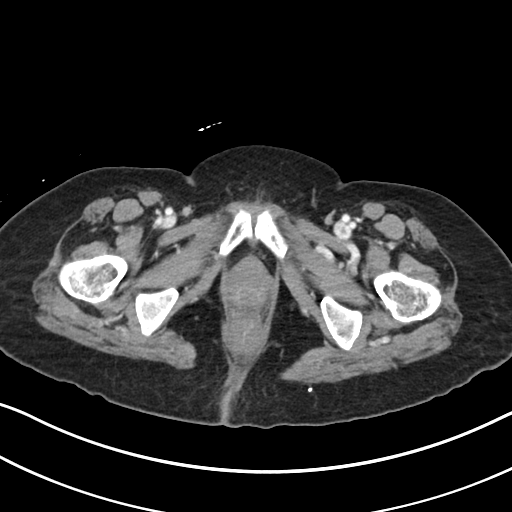
[im 7/85  bone]
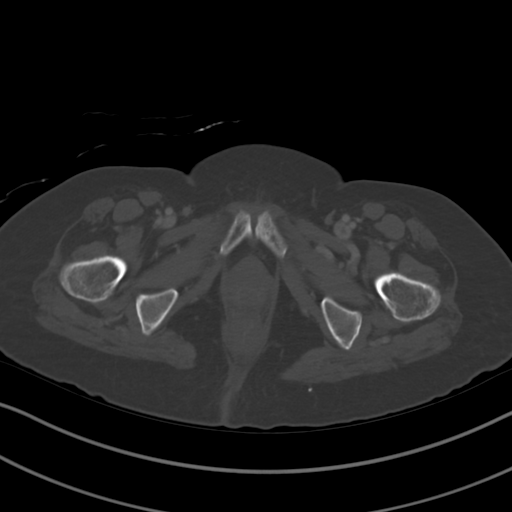
[im 13/85  soft-tissue]
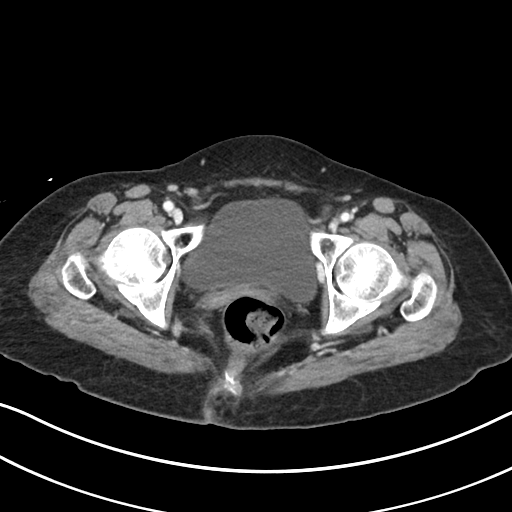
[im 19/85  soft-tissue]
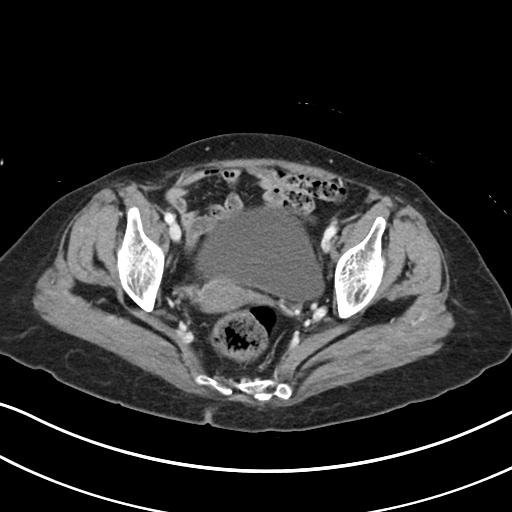
[im 25/85  soft-tissue]
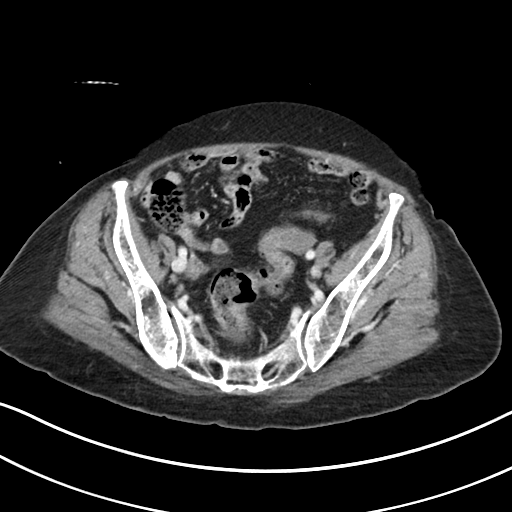
[im 31/85  soft-tissue]
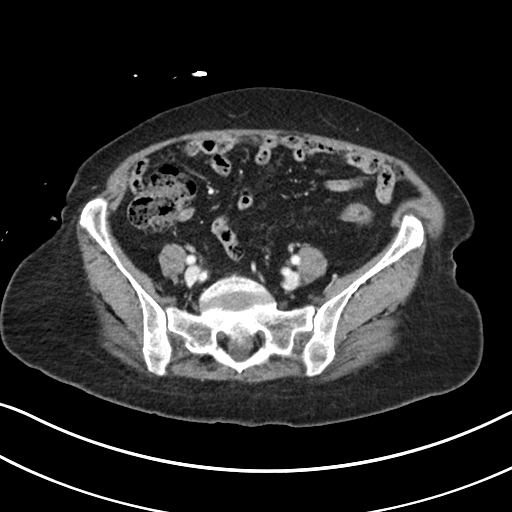
[im 37/85  soft-tissue]
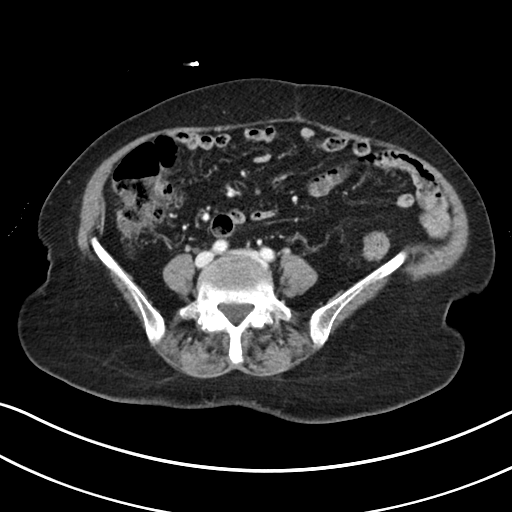
[im 43/85  soft-tissue]
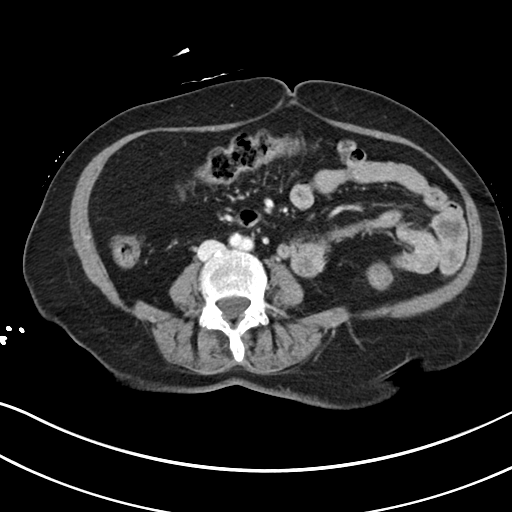
[im 49/85  soft-tissue]
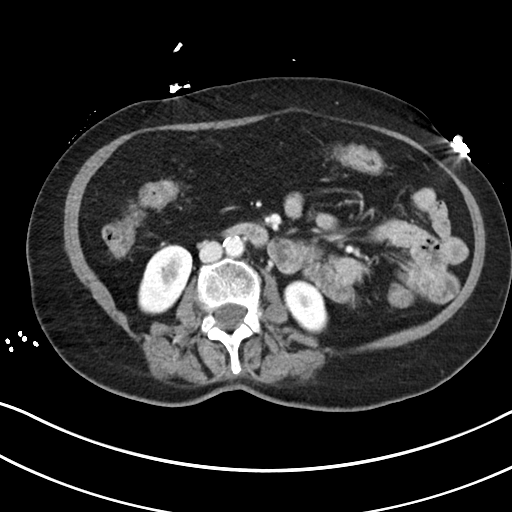
[im 55/85  soft-tissue]
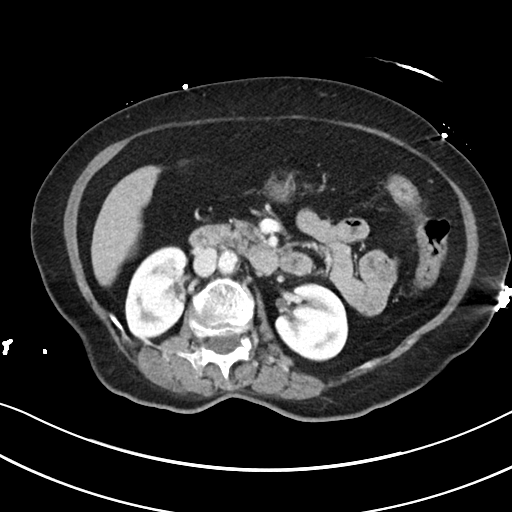
[im 55/85  bone]
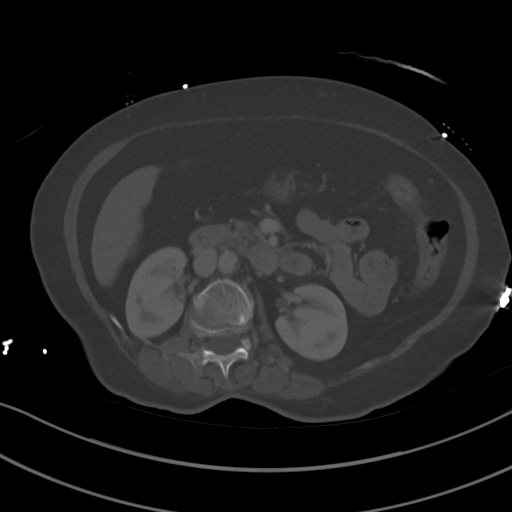
[im 61/85  soft-tissue]
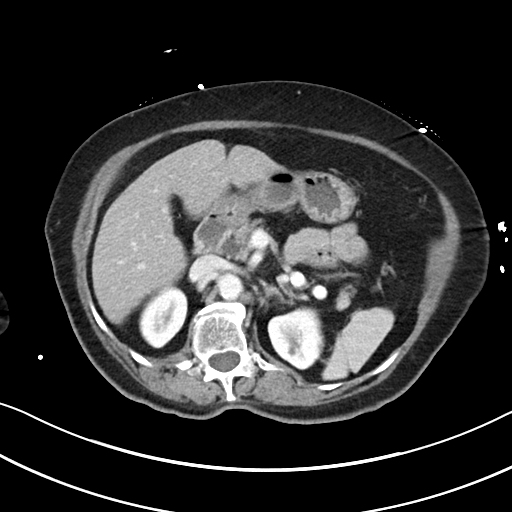
[im 67/85  soft-tissue]
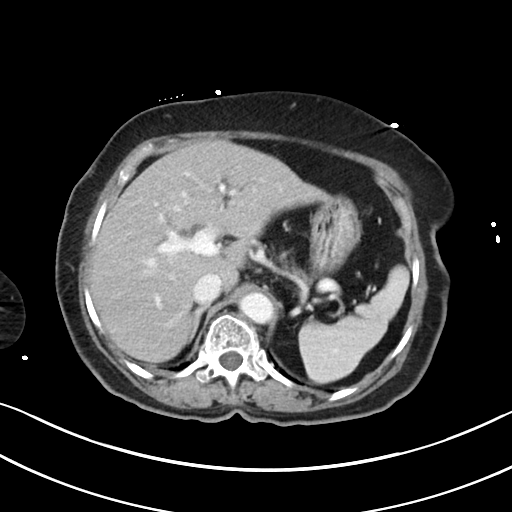
[im 73/85  soft-tissue]
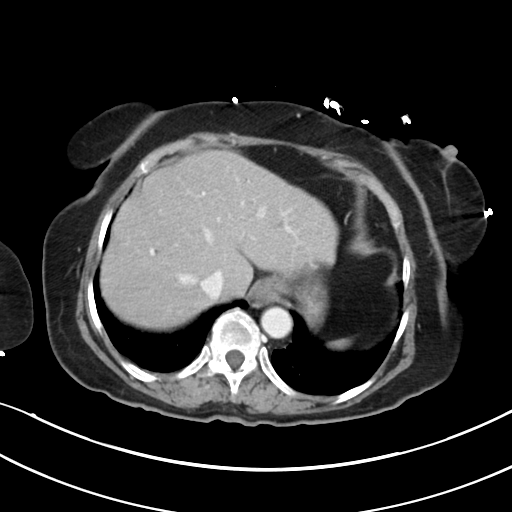
[im 79/85  soft-tissue]
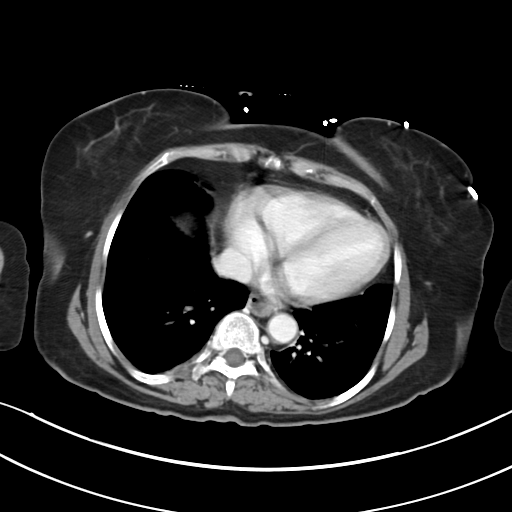

[Series 6: abdomen 3.0 mpr cor · coronal · 0.78mm/px · 3 of 84 slices shown]
[im 28/84  soft-tissue]
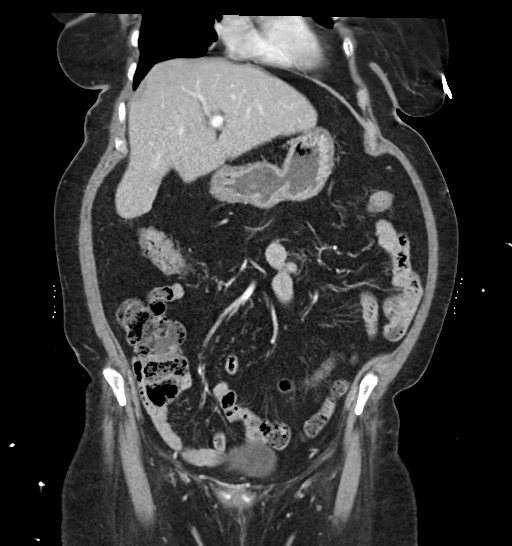
[im 37/84  soft-tissue]
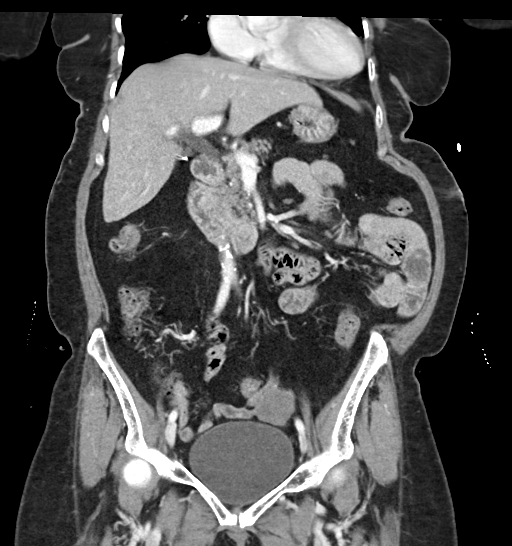
[im 47/84  soft-tissue]
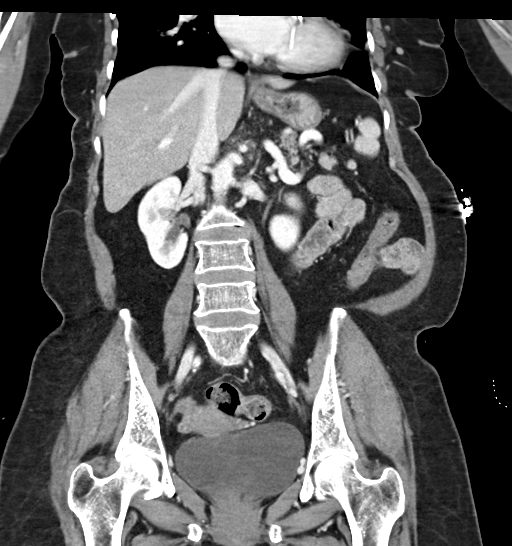

[16 of 46 positions shown; findings below may reference images not displayed]

FINDINGS: Lower chest: The heart is enlarged and there is no pericardial
effusion. Atelectasis is present at the lung bases.

Hepatobiliary: No focal liver abnormality. Intrahepatic and
extrahepatic biliary ductal dilatation is noted which may be within
normal limits for patient's post cholecystectomy status.

Pancreas: Unremarkable. No pancreatic ductal dilatation or
surrounding inflammatory changes.

Spleen: Normal in size without focal abnormality.

Adrenals/Urinary Tract: The adrenal glands are within normal limits.
No renal calculus or hydronephrosis is seen bilaterally.
Subcentimeter hypodensities are present in the kidneys which are too
small to further characterize. The bladder is unremarkable.

Stomach/Bowel: There is a small hiatal hernia. The stomach is
otherwise within normal limits. No bowel obstruction, free air, or
pneumatosis. A few scattered diverticula are present along the colon
without evidence of diverticulitis. The visualized portion of the
appendix is within normal limits.

Vascular/Lymphatic: Aortic atherosclerosis. No enlarged abdominal or
pelvic lymph nodes.

Reproductive: The uterus is within normal limits. The left ovary is
prominent in size for patient's stated age at 3.2 x 2.6 cm.

Other: No free fluid. Small fat containing periumbilical hernias are
noted.

Musculoskeletal: There is a compression deformity at L1 with loss of
vertebral body height of approximately 90% centrally and a
retropulsed element, resulting in mild-to-moderate spinal canal
stenosis at this level.
IMPRESSION: 1. No acute intra-abdominal process.
2. Cardiomegaly.
3. Small hiatal hernia.
4. Prominent left ovary for patient's stated age measuring 3.2 x
cm. Ultrasound is suggested for further evaluation on a nonemergent
basis.
5. Compression deformity at L1 with slightly retropulsed element
resulting in mild to moderate spinal canal stenosis which may be
chronic. Correlation with physical exam is recommended.

## 2022-04-29 IMAGING — DX DG CHEST 2V
2 series · 2 of 2 positions shown · non-contrast
Comparison: Chest radiograph dated 08/28/2021.

CLINICAL DATA: Cough.

EXAM:
CHEST - 2 VIEW

[chest pa]
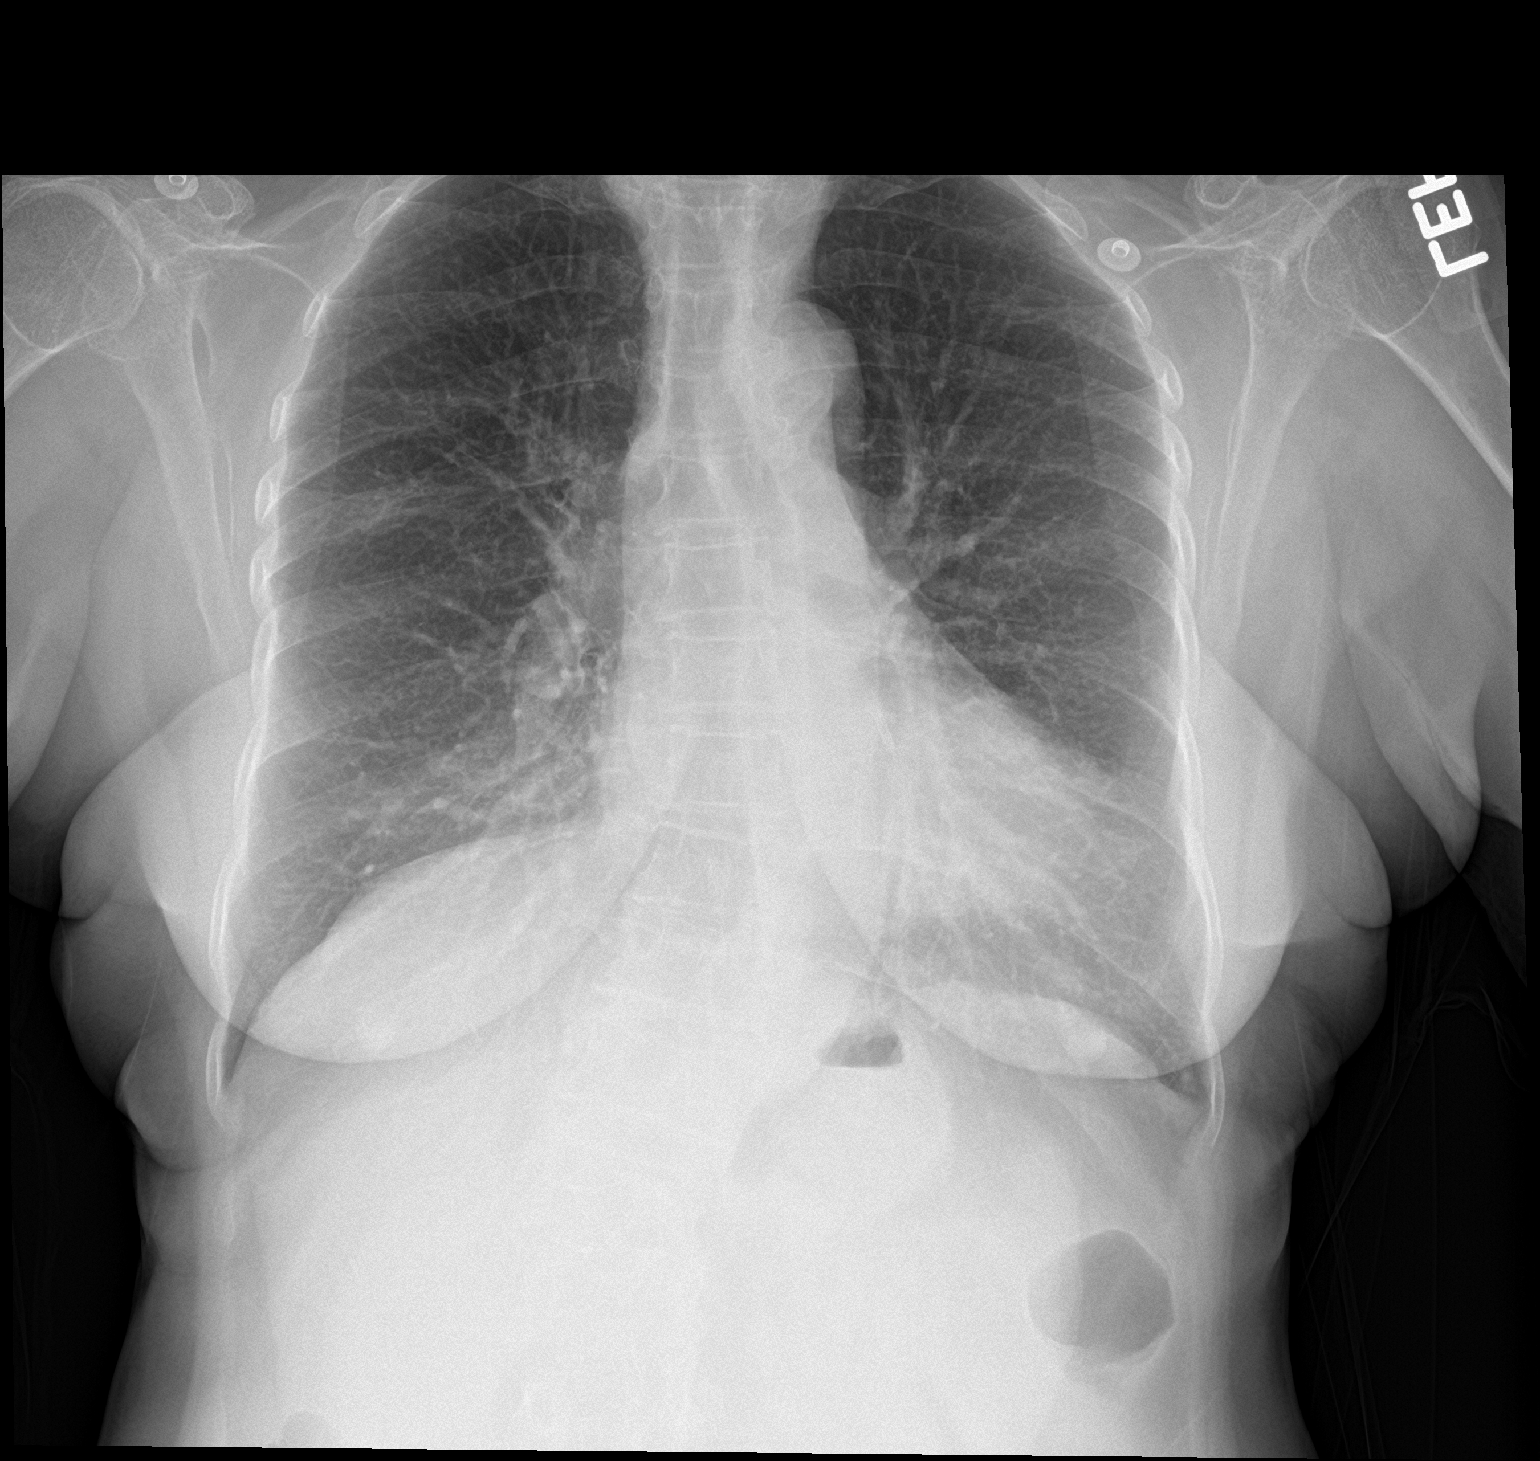

[chest lat]
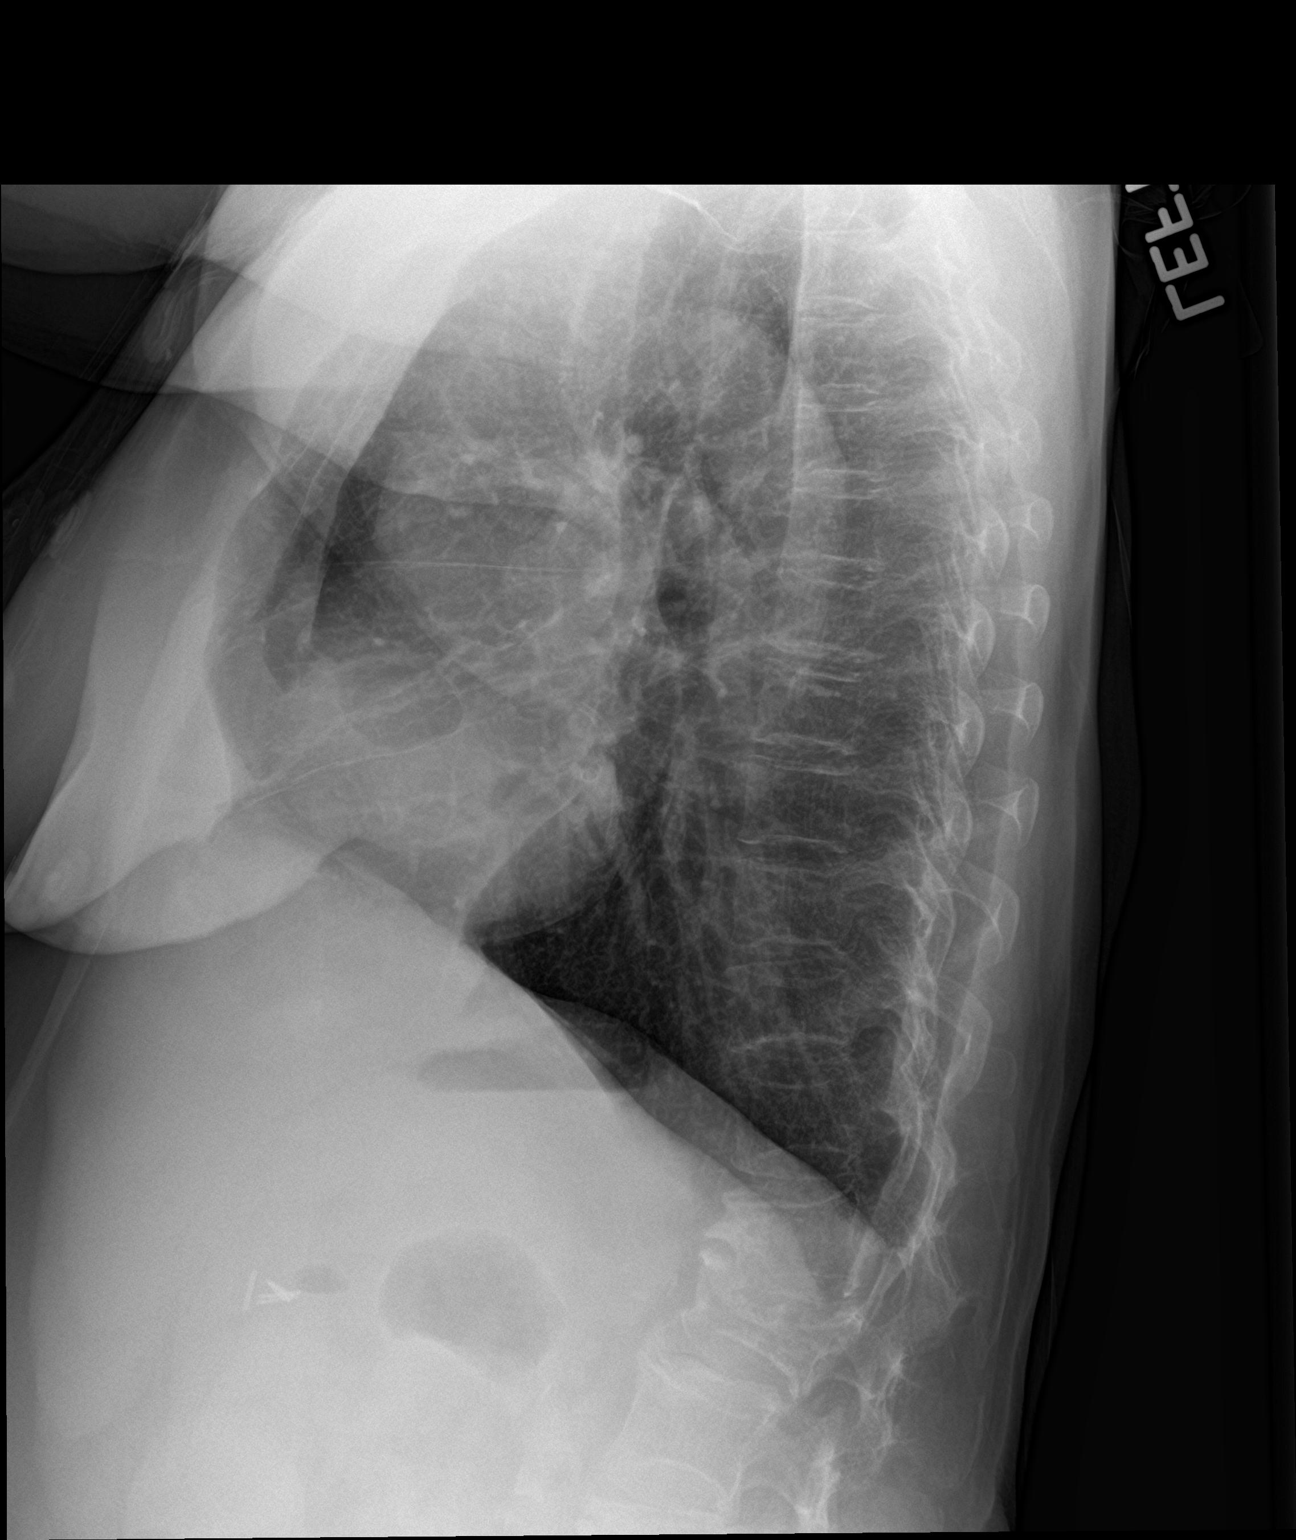

[2 of 2 positions shown; findings below may reference images not displayed]

FINDINGS: No focal consolidation, pleural effusion, pneumothorax. The cardiac
silhouette is within limits. No acute osseous pathology.
Degenerative changes of the spine and old lower thoracic/upper
lumbar compression fracture.
IMPRESSION: No active cardiopulmonary disease.

## 2022-05-13 ENCOUNTER — Other Ambulatory Visit: Payer: Self-pay | Admitting: Internal Medicine

## 2022-09-23 ENCOUNTER — Encounter: Payer: Self-pay | Admitting: Internal Medicine

## 2022-09-23 ENCOUNTER — Ambulatory Visit (INDEPENDENT_AMBULATORY_CARE_PROVIDER_SITE_OTHER): Payer: Self-pay | Admitting: Internal Medicine

## 2022-09-23 VITALS — BP 160/96 | HR 72 | Resp 16 | Ht 59.0 in | Wt 127.0 lb

## 2022-09-23 DIAGNOSIS — I2510 Atherosclerotic heart disease of native coronary artery without angina pectoris: Secondary | ICD-10-CM

## 2022-09-23 DIAGNOSIS — E119 Type 2 diabetes mellitus without complications: Secondary | ICD-10-CM

## 2022-09-23 DIAGNOSIS — I10 Essential (primary) hypertension: Secondary | ICD-10-CM

## 2022-09-23 DIAGNOSIS — E782 Mixed hyperlipidemia: Secondary | ICD-10-CM

## 2022-09-23 MED ORDER — ATORVASTATIN CALCIUM 80 MG PO TABS: ORAL_TABLET | ORAL | 11 refills | Status: DC

## 2022-09-23 MED ORDER — LOSARTAN POTASSIUM 50 MG PO TABS: 50.0000 mg | ORAL_TABLET | Freq: Every day | ORAL | 3 refills | Status: DC

## 2022-09-23 MED ORDER — METFORMIN HCL ER 500 MG PO TB24: ORAL_TABLET | ORAL | 3 refills | Status: DC

## 2022-09-23 MED ORDER — GLIPIZIDE 5 MG PO TABS: 5.0000 mg | ORAL_TABLET | Freq: Two times a day (BID) | ORAL | 3 refills | Status: DC

## 2022-09-23 NOTE — Progress Notes (Signed)
Subjective:    Patient ID: Stephanie Cooke, female   DOB: 08-21-1961, 61 y.o.   MRN: [id number]   HPI   CAD with stent:  never filled Brilinta and did not follow up in May.  Long discussion about need to communicate and follow up with son, who interprets.    Not sure if worth risk if not compliant with Brilinta after being off for almost a year.  Will speak with Dr Swaziland.  She has not had any chest pain.  2.  Hypertension:  States taking her meds and never misses.  Son states he checks behind her--he puts her pills in a daily bag.    3.  DM:  not checking sugars very often.  A1C was 7.7% in February.  States he was called for an eye appt this year, but when called the [facility] clinic back, could never get an answer.    4.  Hyperlipidemia:  Cholesterol much too high in May--did not follow up after labs.  Never filled in February or since--did not go to [facility].  Orange card is out of date and will not be able to fill.  5.  HM:  refusing all vaccines.  Refusing mammogram and pap smear as well.  Current Meds  Medication Sig   acetaminophen (TYLENOL) 500 MG tablet Take 500 mg by mouth every 6 (six) hours as needed for headache, fever or moderate pain.   AgaMatrix Ultra-Thin Lancets MISC Check blood glucose twice daily before meals (Patient taking differently: Check blood glucose twice daily before meals. inconsistent)   aspirin 81 MG EC tablet Take 1 tablet (81 mg total) by mouth daily.   atorvastatin (LIPITOR) 80 MG tablet 1 tab by mouth daily with evening meal   Blood Glucose Monitoring Suppl (AGAMATRIX PRESTO) w/Device KIT Check blood glucose twice daily before meals.   carvedilol (COREG) 3.125 MG tablet 1 tab by mouth twice daily.   glipiZIDE (GLUCOTROL) 5 MG tablet TAKE 1 TABLET BY MOUTH TWICE DAILY WITH A MEAL   glucose blood (AGAMATRIX PRESTO TEST) test strip Check blood glucose twice daily before meals   ibuprofen (ADVIL) 200 MG tablet Take 400 mg by mouth every 6 (six) hours as  needed for headache, fever or mild pain.   losartan (COZAAR) 50 MG tablet Take 1 tablet (50 mg total) by mouth daily.   metFORMIN (GLUCOPHAGE-XR) 500 MG 24 hr tablet TAKE 1 TABLET BY MOUTH TWICE DAILY WITH MEALS   nitroGLYCERIN (NITROSTAT) 0.4 MG SL tablet Place 1 tablet (0.4 mg total) under the tongue every 5 (five) minutes x 3 doses as needed for chest pain.   Allergies  Allergen Reactions   Lisinopril Cough     Review of Systems    Objective:   BP (!) 160/96 (BP Location: Right Arm, Patient Position: Sitting, Cuff Size: Normal)   Pulse 72   Resp 16   Ht 4\' 11"  (1.499 m)   Wt 127 lb (57.6 kg)   LMP  (LMP Unknown)   BMI 25.65 kg/m   Physical Exam NAD HEENT:  PERRL, EOMI Neck:  Supple, No adenopathy, no thyromegaly Chest:  CTA CV:  RRR with normal S1 and S2, No S3, S4 or murmur.  No carotid bruits.  Carotid, radial and DP pulses normal and equal. Abd:  S NT, No HSM or mass, + BS LE:  No edema.     Assessment & Plan    CAD with stent:  will discuss with cardiology and whether needs  Brilinta.  Has been off for about 1 year.  MI with stenting in 10/2018.  2.  Hypertension:  not controlled.  Suspect not compliant with Losartan and Carvedilol.  3.  DM:  Fair control in recent past.  Refill Glipizide and Metformin.  Needs orange card for eye exam.  4.  Hyperlipidemia:  Not taking atrovatstatin--needs orange care.  Refill to retail pharmacy until has the orange card updated.  5.  HM:  no interest in vaccines due.  Encouraged getting back on all meds.  Fasting labs in 4 months with OV subsequently.

## 2023-03-28 ENCOUNTER — Other Ambulatory Visit: Payer: Self-pay

## 2023-03-28 MED ORDER — CARVEDILOL 3.125 MG PO TABS: ORAL_TABLET | ORAL | 5 refills | Status: DC

## 2023-04-05 ENCOUNTER — Other Ambulatory Visit: Payer: Self-pay

## 2023-04-08 ENCOUNTER — Ambulatory Visit: Payer: Self-pay | Admitting: Internal Medicine

## 2023-04-15 ENCOUNTER — Other Ambulatory Visit: Payer: Self-pay

## 2023-04-15 DIAGNOSIS — E119 Type 2 diabetes mellitus without complications: Secondary | ICD-10-CM

## 2023-04-15 DIAGNOSIS — E782 Mixed hyperlipidemia: Secondary | ICD-10-CM

## 2023-04-15 DIAGNOSIS — Z79899 Other long term (current) drug therapy: Secondary | ICD-10-CM

## 2023-04-16 LAB — MICROALBUMIN / CREATININE URINE RATIO
Creatinine, Urine: 57.6 mg/dL
Microalb/Creat Ratio: 25 mg/g creat (ref 0–29)
Microalbumin, Urine: 14.5 ug/mL

## 2023-04-16 LAB — CBC WITH DIFFERENTIAL/PLATELET
Basophils Absolute: 0.1 10*3/uL (ref 0.0–0.2)
Basos: 1 %
EOS (ABSOLUTE): 0.1 10*3/uL (ref 0.0–0.4)
Eos: 2 %
Hematocrit: 46 % (ref 34.0–46.6)
Hemoglobin: 15.1 g/dL (ref 11.1–15.9)
Immature Grans (Abs): 0 10*3/uL (ref 0.0–0.1)
Immature Granulocytes: 0 %
Lymphocytes Absolute: 2.4 10*3/uL (ref 0.7–3.1)
Lymphs: 35 %
MCH: 28.5 pg (ref 26.6–33.0)
MCHC: 32.8 g/dL (ref 31.5–35.7)
MCV: 87 fL (ref 79–97)
Monocytes Absolute: 0.5 10*3/uL (ref 0.1–0.9)
Monocytes: 7 %
Neutrophils Absolute: 3.8 10*3/uL (ref 1.4–7.0)
Neutrophils: 55 %
Platelets: 262 10*3/uL (ref 150–450)
RBC: 5.29 x10E6/uL — ABNORMAL HIGH (ref 3.77–5.28)
RDW: 12.9 % (ref 11.7–15.4)
WBC: 7 10*3/uL (ref 3.4–10.8)

## 2023-04-16 LAB — COMPREHENSIVE METABOLIC PANEL
ALT: 16 IU/L (ref 0–32)
AST: 17 IU/L (ref 0–40)
Albumin: 4.1 g/dL (ref 3.9–4.9)
Alkaline Phosphatase: 113 IU/L (ref 44–121)
BUN/Creatinine Ratio: 19 (ref 12–28)
BUN: 13 mg/dL (ref 8–27)
Bilirubin Total: 0.3 mg/dL (ref 0.0–1.2)
CO2: 23 mmol/L (ref 20–29)
Calcium: 9.5 mg/dL (ref 8.7–10.3)
Chloride: 106 mmol/L (ref 96–106)
Creatinine, Ser: 0.69 mg/dL (ref 0.57–1.00)
Globulin, Total: 2.8 g/dL (ref 1.5–4.5)
Glucose: 164 mg/dL — ABNORMAL HIGH (ref 70–99)
Potassium: 4.1 mmol/L (ref 3.5–5.2)
Sodium: 144 mmol/L (ref 134–144)
Total Protein: 6.9 g/dL (ref 6.0–8.5)
eGFR: 97 mL/min/{1.73_m2} (ref >59)

## 2023-04-16 LAB — HEMOGLOBIN A1C
Est. average glucose Bld gHb Est-mCnc: 183 mg/dL
Hgb A1c MFr Bld: 8 % — ABNORMAL HIGH (ref 4.8–5.6)

## 2023-04-16 LAB — LIPID PANEL W/O CHOL/HDL RATIO
Cholesterol, Total: 239 mg/dL — ABNORMAL HIGH (ref 100–199)
HDL: 44 mg/dL (ref >39)
LDL Chol Calc (NIH): 165 mg/dL — ABNORMAL HIGH (ref 0–99)
Triglycerides: 161 mg/dL — ABNORMAL HIGH (ref 0–149)
VLDL Cholesterol Cal: 30 mg/dL (ref 5–40)

## 2023-04-18 DIAGNOSIS — I2510 Atherosclerotic heart disease of native coronary artery without angina pectoris: Secondary | ICD-10-CM | POA: Insufficient documentation

## 2023-04-19 ENCOUNTER — Encounter: Payer: Self-pay | Admitting: Internal Medicine

## 2023-04-19 ENCOUNTER — Ambulatory Visit: Payer: Self-pay | Admitting: Internal Medicine

## 2023-04-19 VITALS — BP 150/80 | HR 75 | Resp 16 | Ht 59.0 in | Wt 131.0 lb

## 2023-04-19 DIAGNOSIS — E782 Mixed hyperlipidemia: Secondary | ICD-10-CM

## 2023-04-19 DIAGNOSIS — I10 Essential (primary) hypertension: Secondary | ICD-10-CM

## 2023-04-19 DIAGNOSIS — E119 Type 2 diabetes mellitus without complications: Secondary | ICD-10-CM

## 2023-04-19 DIAGNOSIS — I2510 Atherosclerotic heart disease of native coronary artery without angina pectoris: Secondary | ICD-10-CM

## 2023-04-19 DIAGNOSIS — Z23 Encounter for immunization: Secondary | ICD-10-CM

## 2023-04-19 MED ORDER — AGAMATRIX PRESTO TEST VI STRP: ORAL_STRIP | 11 refills | Status: AC

## 2023-04-19 MED ORDER — AGAMATRIX PRESTO W/DEVICE KIT: PACK | 0 refills | Status: AC

## 2023-04-19 MED ORDER — METFORMIN HCL ER 500 MG PO TB24: ORAL_TABLET | ORAL | 3 refills | Status: DC

## 2023-04-19 MED ORDER — AGAMATRIX ULTRA-THIN LANCETS MISC: 11 refills | Status: AC

## 2023-04-19 MED ORDER — LOSARTAN POTASSIUM 50 MG PO TABS: 50.0000 mg | ORAL_TABLET | Freq: Every day | ORAL | 3 refills | Status: DC

## 2023-04-19 MED ORDER — EMPAGLIFLOZIN 10 MG PO TABS: 10.0000 mg | ORAL_TABLET | Freq: Every day | ORAL | 3 refills | Status: DC

## 2023-04-19 MED ORDER — ATORVASTATIN CALCIUM 80 MG PO TABS: ORAL_TABLET | ORAL | 3 refills | Status: DC

## 2023-04-19 MED ORDER — CARVEDILOL 3.125 MG PO TABS: ORAL_TABLET | ORAL | 3 refills | Status: DC

## 2023-04-19 MED ORDER — NITROGLYCERIN 0.4 MG SL SUBL: 0.4000 mg | SUBLINGUAL_TABLET | SUBLINGUAL | 3 refills | Status: DC | PRN

## 2023-04-19 MED ORDER — GLIPIZIDE 5 MG PO TABS: 5.0000 mg | ORAL_TABLET | Freq: Two times a day (BID) | ORAL | 3 refills | Status: DC

## 2023-04-19 NOTE — Progress Notes (Signed)
    Subjective:    Patient ID: Stephanie Cooke, female   DOB: 1961-03-14, 62 y.o.   MRN: [id number]   HPI   DM:  A1C back up to 8.0% May be missing meds.    2.  Hypertension:  not taking Losartan--they are not clear why.  3.  Hyperlipidemia:  problems getting orange card and then there was no Rx at [facility] for atorvastatin.    4.  CAD:  No chest pain or dyspnea.  Does not have an up to date Rx for NTG.    5.  HM:  has not had COVID vaccination, no Shingrix.      Current Meds  Medication Sig   acetaminophen (TYLENOL) 500 MG tablet Take 500 mg by mouth every 6 (six) hours as needed for headache, fever or moderate pain.   AgaMatrix Ultra-Thin Lancets MISC Check blood glucose twice daily before meals (Patient taking differently: Check blood glucose twice daily before meals. inconsistent)   Blood Glucose Monitoring Suppl (AGAMATRIX PRESTO) w/Device KIT Check blood glucose twice daily before meals.   carvedilol (COREG) 3.125 MG tablet 1 tab by mouth twice daily.   glipiZIDE (GLUCOTROL) 5 MG tablet Take 1 tablet (5 mg total) by mouth 2 (two) times daily with a meal.   glucose blood (AGAMATRIX PRESTO TEST) test strip Check blood glucose twice daily before meals   ibuprofen (ADVIL) 200 MG tablet Take 400 mg by mouth every 6 (six) hours as needed for headache, fever or mild pain.   metFORMIN (GLUCOPHAGE-XR) 500 MG 24 hr tablet TAKE 1 TABLET BY MOUTH TWICE DAILY WITH MEALS   nitroGLYCERIN (NITROSTAT) 0.4 MG SL tablet Place 1 tablet (0.4 mg total) under the tongue every 5 (five) minutes x 3 doses as needed for chest pain.   Allergies  Allergen Reactions   Lisinopril Cough     Review of Systems    Objective:   BP (!) 150/80 (BP Location: Left Arm, Patient Position: Sitting, Cuff Size: Normal)   Pulse 75   Resp 16   Ht 4\' 11"  (1.499 m)   Wt 131 lb (59.4 kg)   LMP  (LMP Unknown)   BMI 26.46 kg/m   Physical Exam  NAD HEENT:  PERRL, EOMI Neck:  Supple, No adenopathy Chest:  CTA CV:   RRR with normal S1 and S2, No S3, S4 or murmur.  No carotid bruit.  Carotid, radial and DP pulses normal and equal Abd:  S, NT, No HSM or mass, + BS LE:  No edema.  Diabetic Foot Exam - Simple   Simple Foot Form Diabetic Foot exam was performed with the following findings: Yes 04/19/2023  3:12 PM  Visual Inspection No deformities, no ulcerations, no other skin breakdown bilaterally: Yes Sensation Testing Intact to touch and monofilament testing bilaterally: Yes Pulse Check Posterior Tibialis and Dorsalis pulse intact bilaterally: Yes Comments      Assessment & Plan   DM:  not at goal with control  To make sure she is taking meds regularly.   Current meds refilled.  Adding Jardiance through MAP.  Eye referral  2.  Hypertension:  get back on bp meds.  Refilled.    3.  CAD: stable.  Encouraged her to get back on Brilinta.  4.  Hyperlipidemia:  to get back on atorvastatin--refilled  5.  HM:  Shingrix #1/2

## 2023-07-21 ENCOUNTER — Other Ambulatory Visit: Payer: Self-pay

## 2023-08-10 ENCOUNTER — Encounter: Payer: Self-pay | Admitting: Internal Medicine

## 2023-08-15 ENCOUNTER — Encounter: Payer: Self-pay | Admitting: Internal Medicine

## 2023-08-25 ENCOUNTER — Other Ambulatory Visit: Payer: Self-pay

## 2023-08-25 DIAGNOSIS — E782 Mixed hyperlipidemia: Secondary | ICD-10-CM

## 2023-08-25 DIAGNOSIS — E119 Type 2 diabetes mellitus without complications: Secondary | ICD-10-CM

## 2023-08-25 DIAGNOSIS — Z79899 Other long term (current) drug therapy: Secondary | ICD-10-CM

## 2023-08-26 LAB — LIPID PANEL W/O CHOL/HDL RATIO
Cholesterol, Total: 235 mg/dL — ABNORMAL HIGH (ref 100–199)
HDL: 45 mg/dL (ref >39)
LDL Chol Calc (NIH): 155 mg/dL — ABNORMAL HIGH (ref 0–99)
Triglycerides: 193 mg/dL — ABNORMAL HIGH (ref 0–149)
VLDL Cholesterol Cal: 35 mg/dL (ref 5–40)

## 2023-08-26 LAB — COMPREHENSIVE METABOLIC PANEL
ALT: 14 [IU]/L (ref 0–32)
AST: 15 [IU]/L (ref 0–40)
Albumin: 4.1 g/dL (ref 3.9–4.9)
Alkaline Phosphatase: 123 [IU]/L — ABNORMAL HIGH (ref 44–121)
BUN/Creatinine Ratio: 17 (ref 12–28)
BUN: 10 mg/dL (ref 8–27)
Bilirubin Total: 0.4 mg/dL (ref 0.0–1.2)
CO2: 23 mmol/L (ref 20–29)
Calcium: 9.2 mg/dL (ref 8.7–10.3)
Chloride: 105 mmol/L (ref 96–106)
Creatinine, Ser: 0.6 mg/dL (ref 0.57–1.00)
Globulin, Total: 2.7 g/dL (ref 1.5–4.5)
Glucose: 223 mg/dL — ABNORMAL HIGH (ref 70–99)
Potassium: 4.1 mmol/L (ref 3.5–5.2)
Sodium: 142 mmol/L (ref 134–144)
Total Protein: 6.8 g/dL (ref 6.0–8.5)
eGFR: 100 mL/min/{1.73_m2} (ref >59)

## 2023-08-26 LAB — CBC WITH DIFFERENTIAL/PLATELET
Basophils Absolute: 0.1 10*3/uL (ref 0.0–0.2)
Basos: 1 %
EOS (ABSOLUTE): 0.2 10*3/uL (ref 0.0–0.4)
Eos: 2 %
Hematocrit: 46.1 % (ref 34.0–46.6)
Hemoglobin: 14.7 g/dL (ref 11.1–15.9)
Immature Grans (Abs): 0 10*3/uL (ref 0.0–0.1)
Immature Granulocytes: 0 %
Lymphocytes Absolute: 2.4 10*3/uL (ref 0.7–3.1)
Lymphs: 31 %
MCH: 28.3 pg (ref 26.6–33.0)
MCHC: 31.9 g/dL (ref 31.5–35.7)
MCV: 89 fL (ref 79–97)
Monocytes Absolute: 0.5 10*3/uL (ref 0.1–0.9)
Monocytes: 7 %
Neutrophils Absolute: 4.6 10*3/uL (ref 1.4–7.0)
Neutrophils: 59 %
Platelets: 272 10*3/uL (ref 150–450)
RBC: 5.2 x10E6/uL (ref 3.77–5.28)
RDW: 12.9 % (ref 11.7–15.4)
WBC: 7.8 10*3/uL (ref 3.4–10.8)

## 2023-08-26 LAB — HEMOGLOBIN A1C
Est. average glucose Bld gHb Est-mCnc: 235 mg/dL
Hgb A1c MFr Bld: 9.8 % — ABNORMAL HIGH (ref 4.8–5.6)

## 2024-02-02 ENCOUNTER — Encounter: Payer: Self-pay | Admitting: Internal Medicine

## 2024-02-02 ENCOUNTER — Ambulatory Visit (INDEPENDENT_AMBULATORY_CARE_PROVIDER_SITE_OTHER): Payer: Self-pay | Admitting: Internal Medicine

## 2024-02-02 VITALS — BP 120/70 | HR 89 | Resp 16 | Ht 59.0 in | Wt 128.5 lb

## 2024-02-02 DIAGNOSIS — E782 Mixed hyperlipidemia: Secondary | ICD-10-CM | POA: Diagnosis not present

## 2024-02-02 DIAGNOSIS — I2510 Atherosclerotic heart disease of native coronary artery without angina pectoris: Secondary | ICD-10-CM

## 2024-02-02 DIAGNOSIS — Z1211 Encounter for screening for malignant neoplasm of colon: Secondary | ICD-10-CM

## 2024-02-02 DIAGNOSIS — Z0000 Encounter for general adult medical examination without abnormal findings: Secondary | ICD-10-CM | POA: Diagnosis not present

## 2024-02-02 DIAGNOSIS — Z79899 Other long term (current) drug therapy: Secondary | ICD-10-CM

## 2024-02-02 DIAGNOSIS — Z23 Encounter for immunization: Secondary | ICD-10-CM

## 2024-02-02 DIAGNOSIS — I10 Essential (primary) hypertension: Secondary | ICD-10-CM

## 2024-02-02 DIAGNOSIS — E119 Type 2 diabetes mellitus without complications: Secondary | ICD-10-CM

## 2024-02-02 MED ORDER — ASPIRIN 81 MG PO TBEC: 81.0000 mg | DELAYED_RELEASE_TABLET | Freq: Every day | ORAL | 3 refills | Status: DC

## 2024-02-02 MED ORDER — CARVEDILOL 3.125 MG PO TABS: ORAL_TABLET | ORAL | 3 refills | Status: DC

## 2024-02-02 MED ORDER — NITROGLYCERIN 0.4 MG SL SUBL: 0.4000 mg | SUBLINGUAL_TABLET | SUBLINGUAL | 3 refills | Status: DC | PRN

## 2024-02-02 MED ORDER — GLIPIZIDE 5 MG PO TABS
5.0000 mg | ORAL_TABLET | Freq: Two times a day (BID) | ORAL | 3 refills | Status: DC
Start: 2024-02-02 — End: 2024-02-17

## 2024-02-02 MED ORDER — METFORMIN HCL ER 500 MG PO TB24: ORAL_TABLET | ORAL | 3 refills | Status: DC

## 2024-02-02 MED ORDER — ATORVASTATIN CALCIUM 80 MG PO TABS: ORAL_TABLET | ORAL | 3 refills | Status: DC

## 2024-02-02 MED ORDER — TICAGRELOR 90 MG PO TABS: ORAL_TABLET | ORAL | 3 refills | Status: DC

## 2024-02-02 MED ORDER — EMPAGLIFLOZIN 10 MG PO TABS: 10.0000 mg | ORAL_TABLET | Freq: Every day | ORAL | 3 refills | Status: DC

## 2024-02-02 MED ORDER — LOSARTAN POTASSIUM 50 MG PO TABS: 50.0000 mg | ORAL_TABLET | Freq: Every day | ORAL | 3 refills | Status: DC

## 2024-02-02 NOTE — Progress Notes (Unsigned)
    Subjective:    Patient ID: Stephanie Cooke, female   DOB: 05-03-1961, 63 y.o.   MRN: [id number]   HPI  Son here to interpret  CPE with pap  1.  Pap:  Has not had CPE here in past.  Refused Mammogram/pap and all vaccines in 12.2023..  2.  Mammogram:  As above, refusing screen.  No family history of breast cancer.    3.  Osteoprevention:  States drinks milk twice daily  Perhaps not a full serving.  Yogurt sometimes once daily.  Walking daily outside.    4.  Guaiac Cards/FIT:  Last returned in 2020 and negative for blood.    5.  Colonoscopy:  Never. She is fine with referring for this.  6.  Immunizations:  Needs multiple vaccines.  Does not ever want influenza again as felt terrible for 3-4 days after in 2020.  She will accept one vaccine at a time. Immunization History  Administered Date(s) Administered  . Influenza Inj Mdck Quad Pf 07/31/2019  . Pneumococcal Polysaccharide-23 12/25/2018  . Tdap 12/25/2018  . Zoster Recombinant(Shingrix ) 04/19/2023     7.  Glucose/Cholesterol:  A1C was 9.8% in November, cholesterol was not at goal in November.  Has been off multiple meds for some time.  Chronic issue.  They now have Medicaid. Lipid Panel     Component Value Date/Time   CHOL 235 (H) 08/25/2023 0832   TRIG 193 (H) 08/25/2023 0832   HDL 45 08/25/2023 0832   CHOLHDL 4.5 10/30/2018 1635   VLDL 31 10/30/2018 1635   LDLCALC 155 (H) 08/25/2023 0832   LABVLDL 35 08/25/2023 0832     Current Meds  Medication Sig  . acetaminophen  (TYLENOL ) 500 MG tablet Take 500 mg by mouth every 6 (six) hours as needed for headache, fever or moderate pain.  . AgaMatrix Ultra-Thin Lancets MISC Check blood glucose twice daily before meals  . aspirin  81 MG EC tablet Take 1 tablet (81 mg total) by mouth daily.  . Blood Glucose Monitoring Suppl (AGAMATRIX PRESTO) w/Device KIT Check blood glucose twice daily before meals.  Aaron Aas glucose blood (AGAMATRIX PRESTO TEST) test strip Check blood glucose twice  daily before meals  . losartan  (COZAAR ) 50 MG tablet Take 1 tablet (50 mg total) by mouth daily.  . metFORMIN  (GLUCOPHAGE -XR) 500 MG 24 hr tablet TAKE 1 TABLET BY MOUTH TWICE DAILY WITH MEALS  . nitroGLYCERIN  (NITROSTAT ) 0.4 MG SL tablet Place 1 tablet (0.4 mg total) under the tongue every 5 (five) minutes x 3 doses as needed for chest pain.   Allergies  Allergen Reactions  . Lisinopril  Cough     Review of Systems  Eyes:  Negative for visual disturbance (Has not had eye check this year.).  Respiratory:  Negative for shortness of breath.   Cardiovascular:  Negative for chest pain and leg swelling.       STates not taking Carvedilol  as was not being filled from [facility]      Objective:   BP 120/70 (BP Location: Right Arm, Patient Position: Sitting, Cuff Size: Normal)   Pulse 89   Resp 16   Ht 4\' 11"  (1.499 m)   Wt 128 lb 8 oz (58.3 kg)   LMP  (LMP Unknown)   SpO2 96%   BMI 25.95 kg/m   Physical Exam   Assessment & Plan   CPE without pap [facility] pharmacy.  They have not picked up any prescriptions since Setp 2024

## 2024-02-03 LAB — COMPREHENSIVE METABOLIC PANEL WITH GFR
ALT: 15 IU/L (ref 0–32)
AST: 13 IU/L (ref 0–40)
Albumin: 4.4 g/dL (ref 3.9–4.9)
Alkaline Phosphatase: 143 IU/L — ABNORMAL HIGH (ref 44–121)
BUN/Creatinine Ratio: 19 (ref 12–28)
BUN: 13 mg/dL (ref 8–27)
Bilirubin Total: 0.2 mg/dL (ref 0.0–1.2)
CO2: 24 mmol/L (ref 20–29)
Calcium: 9.8 mg/dL (ref 8.7–10.3)
Chloride: 99 mmol/L (ref 96–106)
Creatinine, Ser: 0.7 mg/dL (ref 0.57–1.00)
Globulin, Total: 2.9 g/dL (ref 1.5–4.5)
Glucose: 394 mg/dL — ABNORMAL HIGH (ref 70–99)
Potassium: 4 mmol/L (ref 3.5–5.2)
Sodium: 138 mmol/L (ref 134–144)
Total Protein: 7.3 g/dL (ref 6.0–8.5)
eGFR: 96 mL/min/{1.73_m2} (ref >59)

## 2024-02-03 LAB — MICROALBUMIN / CREATININE URINE RATIO
Creatinine, Urine: 44.8 mg/dL
Microalb/Creat Ratio: 37 mg/g{creat} — ABNORMAL HIGH (ref 0–29)
Microalbumin, Urine: 16.7 ug/mL

## 2024-02-03 LAB — CBC WITH DIFFERENTIAL/PLATELET
Basophils Absolute: 0.1 10*3/uL (ref 0.0–0.2)
Basos: 1 %
EOS (ABSOLUTE): 0.1 10*3/uL (ref 0.0–0.4)
Eos: 1 %
Hematocrit: 47.9 % — ABNORMAL HIGH (ref 34.0–46.6)
Hemoglobin: 15.3 g/dL (ref 11.1–15.9)
Immature Grans (Abs): 0.1 10*3/uL (ref 0.0–0.1)
Immature Granulocytes: 1 %
Lymphocytes Absolute: 2.9 10*3/uL (ref 0.7–3.1)
Lymphs: 29 %
MCH: 28.7 pg (ref 26.6–33.0)
MCHC: 31.9 g/dL (ref 31.5–35.7)
MCV: 90 fL (ref 79–97)
Monocytes Absolute: 0.5 10*3/uL (ref 0.1–0.9)
Monocytes: 5 %
Neutrophils Absolute: 6.4 10*3/uL (ref 1.4–7.0)
Neutrophils: 63 %
Platelets: 270 10*3/uL (ref 150–450)
RBC: 5.34 x10E6/uL — ABNORMAL HIGH (ref 3.77–5.28)
RDW: 12.6 % (ref 11.7–15.4)
WBC: 10 10*3/uL (ref 3.4–10.8)

## 2024-02-03 LAB — LIPID PANEL W/O CHOL/HDL RATIO
Cholesterol, Total: 247 mg/dL — ABNORMAL HIGH (ref 100–199)
HDL: 50 mg/dL (ref >39)
LDL Chol Calc (NIH): 152 mg/dL — ABNORMAL HIGH (ref 0–99)
Triglycerides: 244 mg/dL — ABNORMAL HIGH (ref 0–149)
VLDL Cholesterol Cal: 45 mg/dL — ABNORMAL HIGH (ref 5–40)

## 2024-02-03 LAB — HGB A1C W/O EAG: Hgb A1c MFr Bld: 11.2 % — ABNORMAL HIGH (ref 4.8–5.6)

## 2024-02-06 ENCOUNTER — Ambulatory Visit: Payer: Self-pay

## 2024-02-06 DIAGNOSIS — Z23 Encounter for immunization: Secondary | ICD-10-CM

## 2024-02-08 ENCOUNTER — Encounter: Payer: Self-pay | Admitting: Internal Medicine

## 2024-02-14 ENCOUNTER — Ambulatory Visit: Payer: Self-pay

## 2024-02-15 ENCOUNTER — Encounter: Payer: Self-pay | Admitting: Internal Medicine

## 2024-02-17 ENCOUNTER — Other Ambulatory Visit: Payer: Self-pay

## 2024-02-17 MED ORDER — LOSARTAN POTASSIUM 50 MG PO TABS: 50.0000 mg | ORAL_TABLET | Freq: Every day | ORAL | 3 refills | Status: AC

## 2024-02-17 MED ORDER — METFORMIN HCL ER 500 MG PO TB24
ORAL_TABLET | ORAL | 3 refills | Status: AC
Start: 2024-02-17 — End: ?

## 2024-02-17 MED ORDER — NITROGLYCERIN 0.4 MG SL SUBL: 0.4000 mg | SUBLINGUAL_TABLET | SUBLINGUAL | 3 refills | Status: AC | PRN

## 2024-02-17 MED ORDER — EMPAGLIFLOZIN 10 MG PO TABS: 10.0000 mg | ORAL_TABLET | Freq: Every day | ORAL | 3 refills | Status: AC

## 2024-02-17 MED ORDER — GLIPIZIDE 5 MG PO TABS: 5.0000 mg | ORAL_TABLET | Freq: Two times a day (BID) | ORAL | 3 refills | Status: AC

## 2024-02-17 MED ORDER — ASPIRIN 81 MG PO TBEC: 81.0000 mg | DELAYED_RELEASE_TABLET | Freq: Every day | ORAL | 3 refills | Status: AC

## 2024-02-17 MED ORDER — TICAGRELOR 90 MG PO TABS: ORAL_TABLET | ORAL | 3 refills | Status: AC

## 2024-02-17 MED ORDER — CARVEDILOL 3.125 MG PO TABS: ORAL_TABLET | ORAL | 3 refills | Status: AC

## 2024-02-17 MED ORDER — ATORVASTATIN CALCIUM 80 MG PO TABS: ORAL_TABLET | ORAL | 3 refills | Status: AC

## 2024-05-04 ENCOUNTER — Other Ambulatory Visit: Payer: Self-pay

## 2024-05-25 ENCOUNTER — Other Ambulatory Visit: Payer: Self-pay

## 2024-05-25 DIAGNOSIS — E119 Type 2 diabetes mellitus without complications: Secondary | ICD-10-CM

## 2024-05-25 DIAGNOSIS — E782 Mixed hyperlipidemia: Secondary | ICD-10-CM

## 2024-05-25 DIAGNOSIS — Z79899 Other long term (current) drug therapy: Secondary | ICD-10-CM

## 2024-05-28 LAB — COMPREHENSIVE METABOLIC PANEL WITH GFR
ALT: 14 IU/L (ref 0–32)
AST: 15 IU/L (ref 0–40)
Albumin: 4 g/dL (ref 3.9–4.9)
Alkaline Phosphatase: 129 IU/L — ABNORMAL HIGH (ref 44–121)
BUN/Creatinine Ratio: 15 (ref 12–28)
BUN: 10 mg/dL (ref 8–27)
Bilirubin Total: 0.4 mg/dL (ref 0.0–1.2)
CO2: 23 mmol/L (ref 20–29)
Calcium: 9.4 mg/dL (ref 8.7–10.3)
Chloride: 101 mmol/L (ref 96–106)
Creatinine, Ser: 0.68 mg/dL (ref 0.57–1.00)
Globulin, Total: 2.9 g/dL (ref 1.5–4.5)
Glucose: 368 mg/dL — ABNORMAL HIGH (ref 70–99)
Potassium: 4.4 mmol/L (ref 3.5–5.2)
Sodium: 139 mmol/L (ref 134–144)
Total Protein: 6.9 g/dL (ref 6.0–8.5)
eGFR: 97 mL/min/1.73 (ref >59)

## 2024-05-28 LAB — LIPID PANEL W/O CHOL/HDL RATIO
Cholesterol, Total: 225 mg/dL — ABNORMAL HIGH (ref 100–199)
HDL: 49 mg/dL (ref >39)
LDL Chol Calc (NIH): 149 mg/dL — ABNORMAL HIGH (ref 0–99)
Triglycerides: 148 mg/dL (ref 0–149)
VLDL Cholesterol Cal: 27 mg/dL (ref 5–40)

## 2024-05-28 LAB — HEMOGLOBIN A1C
Est. average glucose Bld gHb Est-mCnc: 338 mg/dL
Hgb A1c MFr Bld: 13.4 — AB (ref 4.8–5.6)

## 2024-06-05 ENCOUNTER — Ambulatory Visit: Payer: Self-pay | Admitting: Internal Medicine

## 2024-07-06 ENCOUNTER — Encounter: Payer: Self-pay | Admitting: Internal Medicine

## 2024-08-04 ENCOUNTER — Ambulatory Visit: Payer: Self-pay | Admitting: Internal Medicine

## 2024-08-07 NOTE — Progress Notes (Signed)
 I talked to her son. The reason that she did not come is that she was not informed.  He said he wants to make appointment.  But branda says you don't not have any empty time till January.Stephanie Cooke is going to put her on waiting list so she can call her as soon as possible.
